# Patient Record
Sex: Female | Born: 1980 | Race: White | Hispanic: No | Smoking: Current every day smoker
Health system: Southern US, Community
[De-identification: ages and names within clinical notes are randomized; demographics above are authoritative.]

## PROBLEM LIST (undated history)

## (undated) DIAGNOSIS — C801 Malignant (primary) neoplasm, unspecified: Secondary | ICD-10-CM

## (undated) DIAGNOSIS — F329 Major depressive disorder, single episode, unspecified: Secondary | ICD-10-CM

## (undated) DIAGNOSIS — F419 Anxiety disorder, unspecified: Secondary | ICD-10-CM

## (undated) DIAGNOSIS — F4310 Post-traumatic stress disorder, unspecified: Secondary | ICD-10-CM

## (undated) DIAGNOSIS — F32A Depression, unspecified: Secondary | ICD-10-CM

## (undated) DIAGNOSIS — I10 Essential (primary) hypertension: Secondary | ICD-10-CM

## (undated) DIAGNOSIS — N200 Calculus of kidney: Secondary | ICD-10-CM

## (undated) DIAGNOSIS — F319 Bipolar disorder, unspecified: Secondary | ICD-10-CM

## (undated) DIAGNOSIS — F909 Attention-deficit hyperactivity disorder, unspecified type: Secondary | ICD-10-CM

## (undated) HISTORY — PX: TUBAL LIGATION: SHX77

## (undated) HISTORY — PX: TONSILLECTOMY: SUR1361

---

## 2014-10-06 ENCOUNTER — Emergency Department: Payer: Self-pay | Admitting: Emergency Medicine

## 2014-10-25 ENCOUNTER — Emergency Department: Payer: Self-pay | Admitting: Internal Medicine

## 2014-11-03 DIAGNOSIS — F419 Anxiety disorder, unspecified: Secondary | ICD-10-CM | POA: Insufficient documentation

## 2014-11-03 DIAGNOSIS — F319 Bipolar disorder, unspecified: Secondary | ICD-10-CM | POA: Insufficient documentation

## 2015-01-06 ENCOUNTER — Emergency Department
Admit: 2015-01-06 | Disposition: A | Discharge status: Home / Self Care | Payer: Self-pay | Admitting: Emergency Medicine

## 2015-01-14 ENCOUNTER — Emergency Department
Admit: 2015-01-14 | Disposition: A | Discharge status: Home / Self Care | Payer: Self-pay | Admitting: Emergency Medicine

## 2015-01-14 LAB — DRUG SCREEN, URINE
Amphetamines, Ur Screen: NEGATIVE
BARBITURATES, UR SCREEN: NEGATIVE
BENZODIAZEPINE, UR SCRN: POSITIVE
CANNABINOID 50 NG, UR ~~LOC~~: NEGATIVE
Cocaine Metabolite,Ur ~~LOC~~: NEGATIVE
MDMA (Ecstasy)Ur Screen: NEGATIVE
METHADONE, UR SCREEN: NEGATIVE
Opiate, Ur Screen: POSITIVE
PHENCYCLIDINE (PCP) UR S: NEGATIVE
Tricyclic, Ur Screen: NEGATIVE

## 2015-01-14 LAB — URINALYSIS, COMPLETE
BILIRUBIN, UR: NEGATIVE
GLUCOSE, UR: NEGATIVE mg/dL (ref 0–75)
Ketone: NEGATIVE
NITRITE: NEGATIVE
Ph: 5 (ref 4.5–8.0)
Protein: NEGATIVE
SPECIFIC GRAVITY: 1.002 (ref 1.003–1.030)

## 2015-03-22 ENCOUNTER — Encounter: Payer: Self-pay | Admitting: Emergency Medicine

## 2015-03-22 ENCOUNTER — Emergency Department
Admission: EM | Admit: 2015-03-22 | Discharge: 2015-03-22 | Disposition: A | Discharge status: Home / Self Care | Payer: Medicare Other | Attending: Emergency Medicine | Admitting: Emergency Medicine

## 2015-03-22 ENCOUNTER — Emergency Department: Payer: Medicare Other

## 2015-03-22 DIAGNOSIS — N3090 Cystitis, unspecified without hematuria: Secondary | ICD-10-CM | POA: Diagnosis not present

## 2015-03-22 DIAGNOSIS — Z720 Tobacco use: Secondary | ICD-10-CM | POA: Diagnosis not present

## 2015-03-22 DIAGNOSIS — R109 Unspecified abdominal pain: Secondary | ICD-10-CM | POA: Diagnosis present

## 2015-03-22 HISTORY — DX: Depression, unspecified: F32.A

## 2015-03-22 HISTORY — DX: Post-traumatic stress disorder, unspecified: F43.10

## 2015-03-22 HISTORY — DX: Bipolar disorder, unspecified: F31.9

## 2015-03-22 HISTORY — DX: Major depressive disorder, single episode, unspecified: F32.9

## 2015-03-22 HISTORY — DX: Attention-deficit hyperactivity disorder, unspecified type: F90.9

## 2015-03-22 HISTORY — DX: Calculus of kidney: N20.0

## 2015-03-22 HISTORY — DX: Anxiety disorder, unspecified: F41.9

## 2015-03-22 LAB — CBC WITH DIFFERENTIAL/PLATELET
Basophils Absolute: 0.1 10*3/uL (ref 0–0.1)
Basophils Relative: 0 %
Eosinophils Absolute: 0.1 10*3/uL (ref 0–0.7)
Eosinophils Relative: 1 %
HEMATOCRIT: 42.4 % (ref 35.0–47.0)
Hemoglobin: 14.5 g/dL (ref 12.0–16.0)
Lymphocytes Relative: 35 %
Lymphs Abs: 4.7 10*3/uL — ABNORMAL HIGH (ref 1.0–3.6)
MCH: 30.7 pg (ref 26.0–34.0)
MCHC: 34.2 g/dL (ref 32.0–36.0)
MCV: 90 fL (ref 80.0–100.0)
Monocytes Absolute: 0.9 10*3/uL (ref 0.2–0.9)
Monocytes Relative: 6 %
Neutro Abs: 7.7 10*3/uL — ABNORMAL HIGH (ref 1.4–6.5)
Neutrophils Relative %: 58 %
Platelets: 337 10*3/uL (ref 150–440)
RBC: 4.72 MIL/uL (ref 3.80–5.20)
RDW: 13.1 % (ref 11.5–14.5)
WBC: 13.4 10*3/uL — ABNORMAL HIGH (ref 3.6–11.0)

## 2015-03-22 LAB — COMPREHENSIVE METABOLIC PANEL
ALBUMIN: 4.9 g/dL (ref 3.5–5.0)
ALT: 28 U/L (ref 14–54)
ANION GAP: 10 (ref 5–15)
AST: 27 U/L (ref 15–41)
Alkaline Phosphatase: 73 U/L (ref 38–126)
BUN: 5 mg/dL — ABNORMAL LOW (ref 6–20)
CALCIUM: 9.2 mg/dL (ref 8.9–10.3)
CO2: 26 mmol/L (ref 22–32)
Chloride: 95 mmol/L — ABNORMAL LOW (ref 101–111)
Creatinine, Ser: 0.79 mg/dL (ref 0.44–1.00)
GFR calc Af Amer: >60 mL/min (ref >60)
GFR calc non Af Amer: >60 mL/min (ref >60)
Glucose, Bld: 109 mg/dL — ABNORMAL HIGH (ref 65–99)
POTASSIUM: 3.5 mmol/L (ref 3.5–5.1)
Sodium: 131 mmol/L — ABNORMAL LOW (ref 135–145)
TOTAL PROTEIN: 8.4 g/dL — AB (ref 6.5–8.1)
Total Bilirubin: 0.5 mg/dL (ref 0.3–1.2)

## 2015-03-22 LAB — URINALYSIS COMPLETE WITH MICROSCOPIC (ARMC ONLY)
BILIRUBIN URINE: NEGATIVE
GLUCOSE, UA: NEGATIVE mg/dL
Ketones, ur: NEGATIVE mg/dL
NITRITE: NEGATIVE
PH: 6 (ref 5.0–8.0)
Protein, ur: NEGATIVE mg/dL
SPECIFIC GRAVITY, URINE: 1.004 — AB (ref 1.005–1.030)

## 2015-03-22 LAB — LIPASE, BLOOD: LIPASE: 39 U/L (ref 22–51)

## 2015-03-22 MED ORDER — CEFTRIAXONE SODIUM IN DEXTROSE 20 MG/ML IV SOLN
INTRAVENOUS | Status: AC
  Administered 2015-03-22: 1 g via INTRAVENOUS
  Filled 2015-03-22: qty 50

## 2015-03-22 MED ORDER — OXYCODONE-ACETAMINOPHEN 5-325 MG PO TABS: 1.0000 | ORAL_TABLET | Freq: Four times a day (QID) | ORAL | Status: DC | PRN

## 2015-03-22 MED ORDER — KETOROLAC TROMETHAMINE 30 MG/ML IJ SOLN
30.0000 mg | Freq: Once | INTRAMUSCULAR | Status: AC
  Administered 2015-03-22: 30 mg via INTRAVENOUS

## 2015-03-22 MED ORDER — SULFAMETHOXAZOLE-TRIMETHOPRIM 800-160 MG PO TABS: 1.0000 | ORAL_TABLET | Freq: Two times a day (BID) | ORAL | Status: DC

## 2015-03-22 MED ORDER — KETOROLAC TROMETHAMINE 30 MG/ML IJ SOLN
INTRAMUSCULAR | Status: AC
Start: 2015-03-22 — End: 2015-03-22
  Administered 2015-03-22: 30 mg via INTRAVENOUS
  Filled 2015-03-22: qty 1

## 2015-03-22 MED ORDER — HYDROMORPHONE HCL 1 MG/ML IJ SOLN
INTRAMUSCULAR | Status: AC
  Administered 2015-03-22: 0.5 mg via INTRAVENOUS
  Filled 2015-03-22: qty 1

## 2015-03-22 MED ORDER — HYDROMORPHONE HCL 1 MG/ML IJ SOLN
INTRAMUSCULAR | Status: AC
  Administered 2015-03-22: 1 mg via INTRAVENOUS
  Filled 2015-03-22: qty 1

## 2015-03-22 MED ORDER — CEFTRIAXONE SODIUM IN DEXTROSE 20 MG/ML IV SOLN
1.0000 g | Freq: Once | INTRAVENOUS | Status: AC
  Administered 2015-03-22: 1 g via INTRAVENOUS

## 2015-03-22 MED ORDER — HYDROMORPHONE HCL 1 MG/ML IJ SOLN
1.0000 mg | Freq: Once | INTRAMUSCULAR | Status: AC
  Administered 2015-03-22: 1 mg via INTRAVENOUS

## 2015-03-22 MED ORDER — HYDROMORPHONE HCL 1 MG/ML IJ SOLN
0.5000 mg | Freq: Once | INTRAMUSCULAR | Status: AC
Start: 2015-03-22 — End: 2015-03-22
  Administered 2015-03-22: 0.5 mg via INTRAVENOUS

## 2015-03-22 NOTE — ED Notes (Signed)
Patient transported to CT 

## 2015-03-22 NOTE — ED Notes (Signed)
Patient requesting pain medication. MD notified, no new orders received.

## 2015-03-22 NOTE — ED Notes (Signed)
MD at bedside. 

## 2015-03-22 NOTE — ED Notes (Signed)
Pt reports left side lower back pain and blood in urine for past 2 days. Unknown fever. Pt also endorses lower abd cramping.

## 2015-03-22 NOTE — ED Notes (Signed)
Returned to room.

## 2015-03-22 NOTE — Discharge Instructions (Signed)
Flank Pain °Flank pain refers to pain that is located on the side of the body between the upper abdomen and the back. The pain may occur over a short period of time (acute) or may be long-term or reoccurring (chronic). It may be mild or severe. Flank pain can be caused by many things. °CAUSES  °Some of the more common causes of flank pain include: °· Muscle strains.   °· Muscle spasms.   °· A disease of your spine (vertebral disk disease).   °· A lung infection (pneumonia).   °· Fluid around your lungs (pulmonary edema).   °· A kidney infection.   °· Kidney stones.   °· A very painful skin rash caused by the chickenpox virus (shingles).   °· Gallbladder disease.   °HOME CARE INSTRUCTIONS  °Home care will depend on the cause of your pain. In general, °· Rest as directed by your caregiver. °· Drink enough fluids to keep your urine clear or pale yellow. °· Only take over-the-counter or prescription medicines as directed by your caregiver. Some medicines may help relieve the pain. °· Tell your caregiver about any changes in your pain. °· Follow up with your caregiver as directed. °SEEK IMMEDIATE MEDICAL CARE IF:  °· Your pain is not controlled with medicine.   °· You have new or worsening symptoms. °· Your pain increases.   °· You have abdominal pain.   °· You have shortness of breath.   °· You have persistent nausea or vomiting.   °· You have swelling in your abdomen.   °· You feel faint or pass out.   °· You have blood in your urine. °· You have a fever or persistent symptoms for more than 2-3 days. °· You have a fever and your symptoms suddenly get worse. °MAKE SURE YOU:  °· Understand these instructions. °· Will watch your condition. °· Will get help right away if you are not doing well or get worse. °Document Released: 11/10/2005 Document Revised: 06/13/2012 Document Reviewed: 05/03/2012 °[employer]® Patient Information ©2015 [employer]. This information is not intended to replace advice given to you by your  health care provider. Make sure you discuss any questions you have with your health care provider. ° °

## 2015-03-22 NOTE — ED Provider Notes (Signed)
[facility] Emergency Department Provider Note     Time seen: ----------------------------------------- 8:24 PM on 03/22/2015 -----------------------------------------    I have reviewed the triage vital signs and the nursing notes.   HISTORY  Chief Complaint Abdominal Pain    HPI Jennifer Taylor is a 34 y.o. female who presents ER for left-sided lower back pain and blood in her urine for last 2 days. Patient states she doesn't history kidney stones, has significant blood in her urine. This pain is out of proportion to her, nothing makes it better or worse. Sharp pain so she went nausea. Pain is severe currently   Past Medical History  Diagnosis Date  . Kidney stones   . Depression   . ADHD (attention deficit hyperactivity disorder)   . PTSD (post-traumatic stress disorder)   . Bipolar 1 disorder   . Anxiety     There are no active problems to display for this patient.   Past Surgical History  Procedure Laterality Date  . Tubal ligation    . Tonsillectomy      Allergies Review of patient's allergies indicates no known allergies.  Social History History  Substance Use Topics  . Smoking status: Current Every Day Smoker -- 1.00 packs/day    Types: Cigarettes  . Smokeless tobacco: Not on file  . Alcohol Use: No    Review of Systems Constitutional: Negative for fever. Eyes: Negative for visual changes. ENT: Negative for sore throat. Cardiovascular: Negative for chest pain. Respiratory: Negative for shortness of breath. Gastrointestinal: Positive for flank pain Genitourinary: Positive for hematuria Musculoskeletal: Negative for back pain. Skin: Negative for rash. Neurological: Negative for headaches, focal weakness or numbness.  10-point ROS otherwise negative.  ____________________________________________   PHYSICAL EXAM:  VITAL SIGNS: ED Triage Vitals  Enc Vitals Group     BP 03/22/15 1841 131/100 mmHg     Pulse  Rate 03/22/15 1841 118     Resp 03/22/15 1841 18     Temp 03/22/15 1841 97.7 F (36.5 C)     Temp Source 03/22/15 1841 Oral     SpO2 03/22/15 1841 99 %     Weight 03/22/15 1841 186 lb (84.369 kg)     Height 03/22/15 1841 5\' 7"  (1.702 m)     Head Cir --      Peak Flow --      Pain Score 03/22/15 1843 8     Pain Loc --      Pain Edu? --      Excl. in [place]? --     Constitutional: Alert and oriented. Well appearing and in no distress. Eyes: Conjunctivae are normal. PERRL. Normal extraocular movements. ENT   Head: Normocephalic and atraumatic.   Nose: No congestion/rhinnorhea.   Mouth/Throat: Mucous membranes are moist.   Neck: No stridor. Hematological/Lymphatic/Immunilogical: No cervical lymphadenopathy. Cardiovascular: Normal rate, regular rhythm. Normal and symmetric distal pulses are present in all extremities. No murmurs, rubs, or gallops. Respiratory: Normal respiratory effort without tachypnea nor retractions. Breath sounds are clear and equal bilaterally. No wheezes/rales/rhonchi. Gastrointestinal: Soft and nontender. No distention. No abdominal bruits. Left CVA tenderness is positive Musculoskeletal: Nontender with normal range of motion in all extremities. No joint effusions.  No lower extremity tenderness nor edema. Neurologic:  Normal speech and language. No gross focal neurologic deficits are appreciated. Speech is normal. No gait instability. Skin:  Skin is warm, dry and intact. No rash noted. Psychiatric: Mood and affect are normal. Speech and behavior are normal. Patient exhibits  appropriate insight and judgment.  ____________________________________________  ED COURSE:  Pertinent labs & imaging results that were available during my care of the patient were reviewed by me and considered in my medical decision making (see chart for details). Will need CT scan or ultrasound to evaluate. Likely renal colic ____________________________________________     LABS (pertinent positives/negatives)  Labs Reviewed  CBC WITH DIFFERENTIAL/PLATELET - Abnormal; Notable for the following:    WBC 13.4 (*)    Neutro Abs 7.7 (*)    Lymphs Abs 4.7 (*)    All other components within normal limits  COMPREHENSIVE METABOLIC PANEL - Abnormal; Notable for the following:    Sodium 131 (*)    Chloride 95 (*)    Glucose, Bld 109 (*)    BUN 5 (*)    Total Protein 8.4 (*)    All other components within normal limits  URINALYSIS COMPLETEWITH MICROSCOPIC ([facility] ONLY) - Abnormal; Notable for the following:    Color, Urine YELLOW (*)    APPearance HAZY (*)    Specific Gravity, Urine 1.004 (*)    Hgb urine dipstick 3+ (*)    Leukocytes, UA 3+ (*)    Bacteria, UA RARE (*)    Squamous Epithelial / LPF 0-5 (*)    All other components within normal limits  LIPASE, BLOOD    RADIOLOGY  CT renal protocol IMPRESSION: Slight hydronephrosis on the right without renal or ureteral calculus. Question recent calculus passage. Pyelonephritis could present similarly and is a differential consideration. No evidence of renal abscess or perinephric fluid on the right.  No hydronephrosis on the left. No left renal or ureteral calculus.  No bowel obstruction. No abscess. Appendix appears normal. There is a minimal ventral hernia containing only fat. ____________________________________________  FINAL ASSESSMENT AND PLAN  Flank pain  Plan: Either recently passed stone or pyelonephritis. Patient was given Toradol, Dilaudid, IV Rocephin. Patient is in no acute distress and stable for outpatient follow-up with her doctor in 2 days for recheck.   Earleen Newport, MD   Earleen Newport, MD 03/22/15 [phone]

## 2015-04-29 ENCOUNTER — Emergency Department: Payer: Medicare Other

## 2015-04-29 ENCOUNTER — Emergency Department
Admission: EM | Admit: 2015-04-29 | Discharge: 2015-04-29 | Disposition: A | Discharge status: Home / Self Care | Payer: Medicare Other | Attending: Emergency Medicine | Admitting: Emergency Medicine

## 2015-04-29 DIAGNOSIS — Z87442 Personal history of urinary calculi: Secondary | ICD-10-CM | POA: Insufficient documentation

## 2015-04-29 DIAGNOSIS — Z720 Tobacco use: Secondary | ICD-10-CM | POA: Diagnosis not present

## 2015-04-29 DIAGNOSIS — Z79899 Other long term (current) drug therapy: Secondary | ICD-10-CM | POA: Insufficient documentation

## 2015-04-29 DIAGNOSIS — R109 Unspecified abdominal pain: Secondary | ICD-10-CM | POA: Insufficient documentation

## 2015-04-29 DIAGNOSIS — R102 Pelvic and perineal pain: Secondary | ICD-10-CM

## 2015-04-29 LAB — CBC
HEMATOCRIT: 41.5 % (ref 35.0–47.0)
HEMOGLOBIN: 14.2 g/dL (ref 12.0–16.0)
MCH: 30.2 pg (ref 26.0–34.0)
MCHC: 34.2 g/dL (ref 32.0–36.0)
MCV: 88.3 fL (ref 80.0–100.0)
Platelets: 295 10*3/uL (ref 150–440)
RBC: 4.7 MIL/uL (ref 3.80–5.20)
RDW: 13 % (ref 11.5–14.5)
WBC: 13.9 10*3/uL — ABNORMAL HIGH (ref 3.6–11.0)

## 2015-04-29 LAB — URINALYSIS COMPLETE WITH MICROSCOPIC (ARMC ONLY)
BILIRUBIN URINE: NEGATIVE
Glucose, UA: NEGATIVE mg/dL
Ketones, ur: NEGATIVE mg/dL
NITRITE: NEGATIVE
Protein, ur: NEGATIVE mg/dL
SPECIFIC GRAVITY, URINE: 1.006 (ref 1.005–1.030)
pH: 6 (ref 5.0–8.0)

## 2015-04-29 LAB — COMPREHENSIVE METABOLIC PANEL
ALT: 27 U/L (ref 14–54)
AST: 27 U/L (ref 15–41)
Albumin: 4.2 g/dL (ref 3.5–5.0)
Alkaline Phosphatase: 65 U/L (ref 38–126)
Anion gap: 8 (ref 5–15)
BILIRUBIN TOTAL: 0.4 mg/dL (ref 0.3–1.2)
BUN: 8 mg/dL (ref 6–20)
CALCIUM: 9.2 mg/dL (ref 8.9–10.3)
CO2: 28 mmol/L (ref 22–32)
Chloride: 103 mmol/L (ref 101–111)
Creatinine, Ser: 0.71 mg/dL (ref 0.44–1.00)
GFR calc Af Amer: >60 mL/min (ref >60)
GFR calc non Af Amer: >60 mL/min (ref >60)
Glucose, Bld: 88 mg/dL (ref 65–99)
Potassium: 3.9 mmol/L (ref 3.5–5.1)
Sodium: 139 mmol/L (ref 135–145)
Total Protein: 7.6 g/dL (ref 6.5–8.1)

## 2015-04-29 LAB — WET PREP, GENITAL
CLUE CELLS WET PREP: NONE SEEN
TRICH WET PREP: NONE SEEN
Yeast Wet Prep HPF POC: NONE SEEN

## 2015-04-29 LAB — CHLAMYDIA/NGC RT PCR (ARMC ONLY)
Chlamydia Tr: NOT DETECTED
N gonorrhoeae: NOT DETECTED

## 2015-04-29 LAB — POCT PREGNANCY, URINE: Preg Test, Ur: NEGATIVE

## 2015-04-29 MED ORDER — HYDROCODONE-ACETAMINOPHEN 5-325 MG PO TABS: 1.0000 | ORAL_TABLET | ORAL | Status: DC | PRN

## 2015-04-29 MED ORDER — CEFTRIAXONE SODIUM 250 MG IJ SOLR
250.0000 mg | Freq: Once | INTRAMUSCULAR | Status: AC
  Administered 2015-04-29: 250 mg via INTRAMUSCULAR
  Filled 2015-04-29: qty 250

## 2015-04-29 MED ORDER — AZITHROMYCIN 250 MG PO TABS
1000.0000 mg | ORAL_TABLET | Freq: Once | ORAL | Status: AC
  Administered 2015-04-29: 1000 mg via ORAL
  Filled 2015-04-29: qty 4

## 2015-04-29 MED ORDER — CEPHALEXIN 500 MG PO CAPS: 500.0000 mg | ORAL_CAPSULE | Freq: Three times a day (TID) | ORAL | Status: AC

## 2015-04-29 MED ORDER — OXYCODONE-ACETAMINOPHEN 5-325 MG PO TABS
2.0000 | ORAL_TABLET | Freq: Once | ORAL | Status: AC
  Administered 2015-04-29: 2 via ORAL
  Filled 2015-04-29: qty 2

## 2015-04-29 MED ORDER — KETOROLAC TROMETHAMINE 10 MG PO TABS
10.0000 mg | ORAL_TABLET | Freq: Once | ORAL | Status: AC
  Administered 2015-04-29: 10 mg via ORAL
  Filled 2015-04-29: qty 1

## 2015-04-29 NOTE — Discharge Instructions (Signed)
You have been seen in the emergency department today for blood in your urine, as well as abdominal pain. Her workup today has shown largely normal results. As we have discussed you have been placed on antibiotics for a possible urinary tract infection. We have also treated for possible sexually transmitted diseases in the emergency department today. Please have any sexual partner tested and treated as well before resuming sexual activity. Please follow-up with your primary care doctor soon as possible regarding your ongoing abdominal pain.    Abdominal Pain, Women Abdominal (stomach, pelvic, or belly) pain can be caused by many things. It is important to tell your doctor:  The location of the pain.  Does it come and go or is it present all the time?  Are there things that start the pain (eating certain foods, exercise)?  Are there other symptoms associated with the pain (fever, nausea, vomiting, diarrhea)? All of this is helpful to know when trying to find the cause of the pain. CAUSES   Stomach: virus or bacteria infection, or ulcer.  Intestine: appendicitis (inflamed appendix), regional ileitis (Crohn's disease), ulcerative colitis (inflamed colon), irritable bowel syndrome, diverticulitis (inflamed diverticulum of the colon), or cancer of the stomach or intestine.  Gallbladder disease or stones in the gallbladder.  Kidney disease, kidney stones, or infection.  Pancreas infection or cancer.  Fibromyalgia (pain disorder).  Diseases of the female organs:  Uterus: fibroid (non-cancerous) tumors or infection.  Fallopian tubes: infection or tubal pregnancy.  Ovary: cysts or tumors.  Pelvic adhesions (scar tissue).  Endometriosis (uterus lining tissue growing in the pelvis and on the pelvic organs).  Pelvic congestion syndrome (female organs filling up with blood just before the menstrual period).  Pain with the menstrual period.  Pain with ovulation (producing an  egg).  Pain with an IUD (intrauterine device, birth control) in the uterus.  Cancer of the female organs.  Functional pain (pain not caused by a disease, may improve without treatment).  Psychological pain.  Depression. DIAGNOSIS  Your doctor will decide the seriousness of your pain by doing an examination.  Blood tests.  X-rays.  Ultrasound.  CT scan (computed tomography, special type of X-ray).  MRI (magnetic resonance imaging).  Cultures, for infection.  Barium enema (dye inserted in the large intestine, to better view it with X-rays).  Colonoscopy (looking in intestine with a lighted tube).  Laparoscopy (minor surgery, looking in abdomen with a lighted tube).  Major abdominal exploratory surgery (looking in abdomen with a large incision). TREATMENT  The treatment will depend on the cause of the pain.   Many cases can be observed and treated at home.  Over-the-counter medicines recommended by your caregiver.  Prescription medicine.  Antibiotics, for infection.  Birth control pills, for painful periods or for ovulation pain.  Hormone treatment, for endometriosis.  Nerve blocking injections.  Physical therapy.  Antidepressants.  Counseling with a psychologist or psychiatrist.  Minor or major surgery. HOME CARE INSTRUCTIONS   Do not take laxatives, unless directed by your caregiver.  Take over-the-counter pain medicine only if ordered by your caregiver. Do not take aspirin because it can cause an upset stomach or bleeding.  Try a clear liquid diet (broth or water) as ordered by your caregiver. Slowly move to a bland diet, as tolerated, if the pain is related to the stomach or intestine.  Have a thermometer and take your temperature several times a day, and record it.  Bed rest and sleep, if it helps the pain.  Avoid sexual intercourse, if it causes pain.  Avoid stressful situations.  Keep your follow-up appointments and tests, as your caregiver  orders.  If the pain does not go away with medicine or surgery, you may try:  Acupuncture.  Relaxation exercises (yoga, meditation).  Group therapy.  Counseling. SEEK MEDICAL CARE IF:   You notice certain foods cause stomach pain.  Your home care treatment is not helping your pain.  You need stronger pain medicine.  You want your IUD removed.  You feel faint or lightheaded.  You develop nausea and vomiting.  You develop a rash.  You are having side effects or an allergy to your medicine. SEEK IMMEDIATE MEDICAL CARE IF:   Your pain does not go away or gets worse.  You have a fever.  Your pain is felt only in portions of the abdomen. The right side could possibly be appendicitis. The left lower portion of the abdomen could be colitis or diverticulitis.  You are passing blood in your stools (bright red or black tarry stools, with or without vomiting).  You have blood in your urine.  You develop chills, with or without a fever.  You pass out. MAKE SURE YOU:   Understand these instructions.  Will watch your condition.  Will get help right away if you are not doing well or get worse. Document Released: 07/17/2007 Document Revised: 02/03/2014 Document Reviewed: 08/06/2009 [facility] Patient Information 2015 [place], [place]. This information is not intended to replace advice given to you by your health care provider. Make sure you discuss any questions you have with your health care provider.

## 2015-04-29 NOTE — ED Notes (Signed)
MD Paduchowski at bedside  

## 2015-04-29 NOTE — ED Provider Notes (Addendum)
[facility] Emergency Department Provider Note  Time seen: 3:03 PM  I have reviewed the triage vital signs and the nursing notes.   HISTORY  Chief Complaint Flank Pain and Hematuria    HPI Jennifer Taylor is a 34 y.o. female with a past medical history of kidney stones, depression, ADHD, PTSD, bipolar, anxiety, presents the emergency department with left flank pain. According to the patient for the past 3 months she has had intermittent left flank to left lower quadrant pain. She was told a long time ago that she had ovarian cysts, but has not had any further evaluation since moving to [place] months ago. Patient was seen here several weeks ago with a similar complaint diagnosed with kidney stones. Of note the patient had a CT scan performed that day which did not show any renal stones or ureteral stones. Patient denies any dysuria at this time. Patient does state moderate vaginal discharge. Denies vaginal bleeding. Denies fever, nausea, vomiting. Patient states she is on oxycodone at home for her chronic pain but states that her primary care doctor is out of town on vacation, she ran out of medication.     Past Medical History  Diagnosis Date  . Kidney stones   . Depression   . ADHD (attention deficit hyperactivity disorder)   . PTSD (post-traumatic stress disorder)   . Bipolar 1 disorder   . Anxiety   . Kidney stones     There are no active problems to display for this patient.   Past Surgical History  Procedure Laterality Date  . Tubal ligation    . Tonsillectomy      Current Outpatient Rx  Name  Route  Sig  Dispense  Refill  . alprazolam (XANAX) 2 MG tablet   Oral   Take 2 mg by mouth 3 (three) times daily.         Marland Kitchen amphetamine-dextroamphetamine (ADDERALL XR) 10 MG 24 hr capsule   Oral   Take 300 mg by mouth daily.         . citalopram (CELEXA) 20 MG tablet   Oral   Take 25 mg by mouth daily.         Marland Kitchen gabapentin  (NEURONTIN) 100 MG capsule   Oral   Take 100 mg by mouth 3 (three) times daily.         Marland Kitchen lamoTRIgine (LAMICTAL) 200 MG tablet   Oral   Take 250 mg by mouth daily.         Marland Kitchen oxyCODONE-acetaminophen (ROXICET) 5-325 MG per tablet   Oral   Take 1 tablet by mouth every 6 (six) hours as needed.   20 tablet   0   . sulfamethoxazole-trimethoprim (BACTRIM DS) 800-160 MG per tablet   Oral   Take 1 tablet by mouth 2 (two) times daily.   20 tablet   0     Allergies Review of patient's allergies indicates no known allergies.  No family history on file.  Social History History  Substance Use Topics  . Smoking status: Current Every Day Smoker -- 1.00 packs/day    Types: Cigarettes  . Smokeless tobacco: Not on file  . Alcohol Use: No    Review of Systems Constitutional: Negative for fever. Cardiovascular: Negative for chest pain. Respiratory: Negative for shortness of breath. Gastrointestinal: Positive for left-sided abdominal pain. Genitourinary: Negative for dysuria. Positive for vaginal discharge. Musculoskeletal: Negative for back pain. 10-point ROS otherwise negative.  ____________________________________________   PHYSICAL EXAM:  VITAL SIGNS: ED Triage Vitals  Enc Vitals Group     BP 04/29/15 1106 129/75 mmHg     Pulse Rate 04/29/15 1106 114     Resp 04/29/15 1106 16     Temp 04/29/15 1106 98.2 F (36.8 C)     Temp Source 04/29/15 1106 Oral     SpO2 04/29/15 1106 98 %     Weight 04/29/15 1106 187 lb (84.823 kg)     Height 04/29/15 1106 5\' 7"  (1.702 m)     Head Cir --      Peak Flow --      Pain Score 04/29/15 1107 7     Pain Loc --      Pain Edu? --      Excl. in [place]? --     Constitutional: Alert and oriented. Well appearing and in no distress. Eyes: Normal exam ENT   Head: Normocephalic and atraumatic. Cardiovascular: Normal rate, regular rhythm. No murmur Respiratory: Normal respiratory effort without tachypnea nor retractions. Breath sounds  are clear  Gastrointestinal: Moderate tenderness palpation in the left lower quadrant/left pelvis area. No CVA tenderness palpation. No rebound no guarding. No distention Musculoskeletal: Nontender with normal range of motion in all extremities.  Neurologic:  Normal speech and language. No gross focal neurologic deficits  Skin:  Skin is warm, dry and intact.  Psychiatric: Mood and affect are normal. Speech and behavior are normal.  ____________________________________________     RADIOLOGY  7 within normal limits  ____________________________________________   INITIAL IMPRESSION / ASSESSMENT AND PLAN / ED COURSE  Pertinent labs & imaging results that were available during my care of the patient were reviewed by me and considered in my medical decision making (see chart for details).  Patient with left lower quadrant abdominal pain, history of ovarian cyst. Patient also with a history of kidney stones, however recent CT scan did not show any obvious stones. We will check labs, pelvic exam, pelvic ultrasound to help further evaluate.    Minimal discharge on exam. Patient wishes to go ahead and be treated for STDs. Patient doesn't mild left adnexal tenderness currently awaiting ultrasound results. Given the patient's hematuria, with no evidence of renal stone on the past CT scan. We will cover the patient with antibiotics, and send a urine culture.  Ultrasound within normal limits. Wet prep within normal limits we'll discharge home on anabiotic. Patient follow up with OB/GYN.  ____________________________________________   FINAL CLINICAL IMPRESSION(S) / ED DIAGNOSES  Left adnexal pain.  Left abdominal pain   Harvest Dark, MD 04/29/15 [place], MD 04/29/15 [phone]

## 2015-04-29 NOTE — ED Notes (Signed)
Pt c/o blood in urine for the past 2 days, and c/o BL Flank pain for the past month and a half.Marland Kitchen

## 2015-04-29 NOTE — ED Notes (Signed)
Pt received medication for pain while awaiting for a treatment room

## 2015-04-29 NOTE — ED Notes (Signed)
Pt to [place] at this time via ED tech.

## 2015-04-30 LAB — URINE CULTURE

## 2015-05-26 ENCOUNTER — Encounter: Payer: Self-pay | Admitting: Urgent Care

## 2015-05-26 DIAGNOSIS — Z3202 Encounter for pregnancy test, result negative: Secondary | ICD-10-CM | POA: Insufficient documentation

## 2015-05-26 DIAGNOSIS — Z23 Encounter for immunization: Secondary | ICD-10-CM | POA: Insufficient documentation

## 2015-05-26 DIAGNOSIS — S61214A Laceration without foreign body of right ring finger without damage to nail, initial encounter: Secondary | ICD-10-CM | POA: Diagnosis not present

## 2015-05-26 DIAGNOSIS — S61216A Laceration without foreign body of right little finger without damage to nail, initial encounter: Secondary | ICD-10-CM | POA: Insufficient documentation

## 2015-05-26 DIAGNOSIS — Z720 Tobacco use: Secondary | ICD-10-CM | POA: Insufficient documentation

## 2015-05-26 DIAGNOSIS — Z79899 Other long term (current) drug therapy: Secondary | ICD-10-CM | POA: Insufficient documentation

## 2015-05-26 DIAGNOSIS — Z4801 Encounter for change or removal of surgical wound dressing: Secondary | ICD-10-CM | POA: Diagnosis not present

## 2015-05-26 DIAGNOSIS — Y998 Other external cause status: Secondary | ICD-10-CM | POA: Insufficient documentation

## 2015-05-26 DIAGNOSIS — Y9289 Other specified places as the place of occurrence of the external cause: Secondary | ICD-10-CM | POA: Insufficient documentation

## 2015-05-26 DIAGNOSIS — Y9389 Activity, other specified: Secondary | ICD-10-CM | POA: Insufficient documentation

## 2015-05-26 DIAGNOSIS — X58XXXD Exposure to other specified factors, subsequent encounter: Secondary | ICD-10-CM | POA: Diagnosis not present

## 2015-05-26 DIAGNOSIS — S61214D Laceration without foreign body of right ring finger without damage to nail, subsequent encounter: Secondary | ICD-10-CM | POA: Diagnosis not present

## 2015-05-26 DIAGNOSIS — W260XXA Contact with knife, initial encounter: Secondary | ICD-10-CM | POA: Insufficient documentation

## 2015-05-26 NOTE — ED Notes (Signed)
Patient presents to ED with lacerations to [facility] - 3rd and 4th digits and RH 1st digit that was sustained tonight while washing dishes; cut on a butcher knife. Patient asking for a pregnancy test in triage; has had BTL; tech collected urine, but no order for test placed by triage nurse.

## 2015-05-27 ENCOUNTER — Encounter: Payer: Self-pay | Admitting: Urgent Care

## 2015-05-27 ENCOUNTER — Emergency Department
Admission: EM | Admit: 2015-05-27 | Discharge: 2015-05-28 | Disposition: A | Discharge status: Home / Self Care | Payer: Medicare Other | Attending: Emergency Medicine | Admitting: Emergency Medicine

## 2015-05-27 ENCOUNTER — Emergency Department
Admission: EM | Admit: 2015-05-27 | Discharge: 2015-05-27 | Disposition: A | Discharge status: Home / Self Care | Payer: Medicare Other | Attending: Emergency Medicine | Admitting: Emergency Medicine

## 2015-05-27 DIAGNOSIS — S61216A Laceration without foreign body of right little finger without damage to nail, initial encounter: Secondary | ICD-10-CM | POA: Diagnosis not present

## 2015-05-27 DIAGNOSIS — IMO0002 Reserved for concepts with insufficient information to code with codable children: Secondary | ICD-10-CM

## 2015-05-27 DIAGNOSIS — S61219A Laceration without foreign body of unspecified finger without damage to nail, initial encounter: Secondary | ICD-10-CM

## 2015-05-27 DIAGNOSIS — Z720 Tobacco use: Secondary | ICD-10-CM | POA: Diagnosis not present

## 2015-05-27 DIAGNOSIS — Z4801 Encounter for change or removal of surgical wound dressing: Secondary | ICD-10-CM | POA: Insufficient documentation

## 2015-05-27 DIAGNOSIS — Y9389 Activity, other specified: Secondary | ICD-10-CM | POA: Diagnosis not present

## 2015-05-27 DIAGNOSIS — S61214D Laceration without foreign body of right ring finger without damage to nail, subsequent encounter: Secondary | ICD-10-CM | POA: Insufficient documentation

## 2015-05-27 DIAGNOSIS — Y9289 Other specified places as the place of occurrence of the external cause: Secondary | ICD-10-CM | POA: Diagnosis not present

## 2015-05-27 DIAGNOSIS — W260XXA Contact with knife, initial encounter: Secondary | ICD-10-CM | POA: Diagnosis not present

## 2015-05-27 DIAGNOSIS — Z3202 Encounter for pregnancy test, result negative: Secondary | ICD-10-CM | POA: Diagnosis not present

## 2015-05-27 DIAGNOSIS — Z79899 Other long term (current) drug therapy: Secondary | ICD-10-CM | POA: Diagnosis not present

## 2015-05-27 DIAGNOSIS — Z23 Encounter for immunization: Secondary | ICD-10-CM | POA: Diagnosis not present

## 2015-05-27 DIAGNOSIS — X58XXXD Exposure to other specified factors, subsequent encounter: Secondary | ICD-10-CM | POA: Insufficient documentation

## 2015-05-27 DIAGNOSIS — Y998 Other external cause status: Secondary | ICD-10-CM | POA: Diagnosis not present

## 2015-05-27 DIAGNOSIS — S61214A Laceration without foreign body of right ring finger without damage to nail, initial encounter: Secondary | ICD-10-CM | POA: Diagnosis present

## 2015-05-27 LAB — POCT PREGNANCY, URINE: Preg Test, Ur: NEGATIVE

## 2015-05-27 MED ORDER — TETANUS-DIPHTH-ACELL PERTUSSIS 5-2.5-18.5 LF-MCG/0.5 IM SUSP
0.5000 mL | Freq: Once | INTRAMUSCULAR | Status: AC
  Administered 2015-05-27: 0.5 mL via INTRAMUSCULAR
  Filled 2015-05-27: qty 0.5

## 2015-05-27 MED ORDER — TRAMADOL HCL 50 MG PO TABS
50.0000 mg | ORAL_TABLET | Freq: Once | ORAL | Status: AC
  Administered 2015-05-27: 50 mg via ORAL
  Filled 2015-05-27: qty 1

## 2015-05-27 MED ORDER — IBUPROFEN 600 MG PO TABS
600.0000 mg | ORAL_TABLET | Freq: Once | ORAL | Status: AC
  Administered 2015-05-27: 600 mg via ORAL
  Filled 2015-05-27: qty 1

## 2015-05-27 NOTE — Discharge Instructions (Signed)
Sterile Tape Wound Care Some cuts and wounds can be closed using sterile tape, also called skin adhesive strips. Skin adhesive strips can be used for shallow (superficial) and simple cuts, wounds, lacerations, and surgical incisions. These strips act in place of stitches to hold the edges of the wound together, allowing for faster healing. Unlike stitches, the adhesive strips do not require needles or anesthetic medicine for placement. The strips will wear off naturally as the wound is healing. It is important to take proper care of your wound at home while it heals.  HOME CARE INSTRUCTIONS  Try to keep the area around your wound clean and dry. Do not allow the adhesive strips to get wet for the first 12 hours.   Do not use any soaps or ointments on the wound for the first 12 hours.   If a bandage (dressing) has been applied, follow your health care provider's instructions for how often to change the dressing. Keep the dressing dry if one has been applied.   Do not remove the adhesive strips. They will fall off on their own. If they do not, you may remove them gently after 10 days. You should gently wet the strips before removing them. For example, this can be done in the shower.  Do not scratch, pick, or rub the wound area.   Protect the wound from further injury until it is healed.   Protect the wound from sun and tanning bed exposure while it is healing and for several weeks after healing.   Only take over-the-counter or prescription medicines as directed by your health care provider.   Keep all follow-up appointments as directed by your health care provider.  SEEK MEDICAL CARE IF: Your adhesive strips become wet or soaked with blood before the wound has healed. The tape will need to be replaced.  SEEK IMMEDIATE MEDICAL CARE IF:  You have increasing pain in the wound.   You develop a rash after the strips are applied.  Your wound becomes red, swollen, hot, or tender.   You  have a red streak that goes away from the wound.   You have pus coming from the wound.   You have increased bleeding from the wound.  You notice a bad smell coming from the wound.   Your wound breaks open. MAKE SURE YOU:  Understand these instructions.  Will watch your condition.  Will get help right away if you are not doing well or get worse. Document Released: 10/27/2004 Document Revised: 07/10/2013 Document Reviewed: 04/10/2013 [facility] Patient Information 2015 [place], [place]. This information is not intended to replace advice given to you by your health care provider. Make sure you discuss any questions you have with your health care provider.  Keep the wound clean, dry, and covered.  Wear the splint as needed for protection. Follow-up with your provider as needed. Take Tylenol or Motrin for pain relief.

## 2015-05-27 NOTE — ED Notes (Signed)
Patient presents with lacerations to fingers; here last night and had steri-strips placed. Patient reports that strips came off today and she is back to have the replaced. Fingers swollen per her report.

## 2015-05-27 NOTE — ED Provider Notes (Signed)
[facility] Emergency Department Provider Note ____________________________________________  Time seen: 2330  I have reviewed the triage vital signs and the nursing notes.  HISTORY  Chief Complaint  Laceration  HPI  Jennifer Taylor is a 34 y.o. female presents for wound check. The wound is without signs of infection.  The wound edges are not approximated due to loss of previously placed steri-strips.   Review of Systems  Constitutional: Negative for fever. HEENT:  Normocephalic/atraumatic. Negative for visual/hearingchanges, sore throat, or nasal congestion. Cardiovascular: Negative for chest pain. Respiratory: Negative for shortness of breath. Musculoskeletal: Negative for back pain. Skin: Linear laceration down with ulnar side of her right ring finger distal phalanx.  Neurological: Negative for headaches, focal weakness or numbness. Hematological/Lymphatic:Negative for enlarged lymph nodes  LACERATION REPAIR Performed by: Melvenia Needles Authorized by: Melvenia Needles Consent: Verbal consent obtained. Risks and benefits: risks, benefits and alternatives were discussed Consent given by: patient Patient identity confirmed: provided demographic data Prepped and Draped in normal sterile fashion Wound explored  Laceration Location: right ring finger  Laceration Length: 2 cm  No Foreign Bodies seen or palpated  Irrigation method: soap & water wash  Amount of cleaning: standard  Skin closure: steri-strips  Patient tolerance: Patient tolerated the procedure well with no immediate complications. _____________________________________________________________  INITIAL IMPRESSION / ASSESSMENT AND PLAN / ED COURSE  Old laceration s/p steri-strip repair. Finger splint placed for protection. Ultram 50 mg PO x 1 provider for acute pain. No prescription for narcotic pain medicine, despite patient's request.  Wound care and activity  instructions given. Return prn.  FINAL CLINICAL IMPRESSION(S) / ED DIAGNOSES  Final diagnoses:  Encounter for re-check of laceration wound    Melvenia Needles, PA-C 05/28/15 0003  Orbie Pyo, MD 05/28/15 [phone]

## 2015-05-27 NOTE — Discharge Instructions (Signed)
Laceration Care, Adult °A laceration is a cut or lesion that goes through all layers of the skin and into the tissue just beneath the skin. °TREATMENT  °Some lacerations may not require closure. Some lacerations may not be able to be closed due to an increased risk of infection. It is important to see your caregiver as soon as possible after an injury to minimize the risk of infection and maximize the opportunity for successful closure. °If closure is appropriate, pain medicines may be given, if needed. The wound will be cleaned to help prevent infection. Your caregiver will use stitches (sutures), staples, wound glue (adhesive), or skin adhesive strips to repair the laceration. These tools bring the skin edges together to allow for faster healing and a better cosmetic outcome. However, all wounds will heal with a scar. Once the wound has healed, scarring can be minimized by covering the wound with sunscreen during the day for 1 full year. °HOME CARE INSTRUCTIONS  °For sutures or staples: °· Keep the wound clean and dry. °· If you were given a bandage (dressing), you should change it at least once a day. Also, change the dressing if it becomes wet or dirty, or as directed by your caregiver. °· Wash the wound with soap and water 2 times a day. Rinse the wound off with water to remove all soap. Pat the wound dry with a clean towel. °· After cleaning, apply a thin layer of the antibiotic ointment as recommended by your caregiver. This will help prevent infection and keep the dressing from sticking. °· You may shower as usual after the first 24 hours. Do not soak the wound in water until the sutures are removed. °· Only take over-the-counter or prescription medicines for pain, discomfort, or fever as directed by your caregiver. °· Get your sutures or staples removed as directed by your caregiver. °For skin adhesive strips: °· Keep the wound clean and dry. °· Do not get the skin adhesive strips wet. You may bathe  carefully, using caution to keep the wound dry. °· If the wound gets wet, pat it dry with a clean towel. °· Skin adhesive strips will fall off on their own. You may trim the strips as the wound heals. Do not remove skin adhesive strips that are still stuck to the wound. They will fall off in time. °For wound adhesive: °· You may briefly wet your wound in the shower or bath. Do not soak or scrub the wound. Do not swim. Avoid periods of heavy perspiration until the skin adhesive has fallen off on its own. After showering or bathing, gently pat the wound dry with a clean towel. °· Do not apply liquid medicine, cream medicine, or ointment medicine to your wound while the skin adhesive is in place. This may loosen the film before your wound is healed. °· If a dressing is placed over the wound, be careful not to apply tape directly over the skin adhesive. This may cause the adhesive to be pulled off before the wound is healed. °· Avoid prolonged exposure to sunlight or tanning lamps while the skin adhesive is in place. Exposure to ultraviolet light in the first year will darken the scar. °· The skin adhesive will usually remain in place for 5 to 10 days, then naturally fall off the skin. Do not pick at the adhesive film. °You may need a tetanus shot if: °· You cannot remember when you had your last tetanus shot. °· You have never had a tetanus   shot. If you get a tetanus shot, your arm may swell, get red, and feel warm to the touch. This is common and not a problem. If you need a tetanus shot and you choose not to have one, there is a rare chance of getting tetanus. Sickness from tetanus can be serious. SEEK MEDICAL CARE IF:   You have redness, swelling, or increasing pain in the wound.  You see a red line that goes away from the wound.  You have yellowish-white fluid (pus) coming from the wound.  You have a fever.  You notice a bad smell coming from the wound or dressing.  Your wound breaks open before or  after sutures have been removed.  You notice something coming out of the wound such as wood or glass.  Your wound is on your hand or foot and you cannot move a finger or toe. SEEK IMMEDIATE MEDICAL CARE IF:   Your pain is not controlled with prescribed medicine.  You have severe swelling around the wound causing pain and numbness or a change in color in your arm, hand, leg, or foot.  Your wound splits open and starts bleeding.  You have worsening numbness, weakness, or loss of function of any joint around or beyond the wound.  You develop painful lumps near the wound or on the skin anywhere on your body. MAKE SURE YOU:   Understand these instructions.  Will watch your condition.  Will get help right away if you are not doing well or get worse. Document Released: 09/19/2005 Document Revised: 12/12/2011 Document Reviewed: 03/15/2011 [facility] Patient Information 2015 [place], [place]. This information is not intended to replace advice given to you by your health care provider. Make sure you discuss any questions you have with your health care provider.  Non-Sutured Laceration A laceration is a cut or wound that goes through all layers of the skin and into the tissue just beneath the skin. Usually, these are stitched up or held together with tape or glue shortly after the injury occurred. However, if several or more hours have passed before getting care, too many germs (bacteria) get into the laceration. Stitching it closed would bring the risk of infection. If your health care provider feels your laceration is too old, it may be left open and then bandaged to allow healing from the bottom layer up. HOME CARE INSTRUCTIONS   Change the bandage (dressing) 2 times a day or as directed by your health care provider.  If the dressing or packing gauze sticks, soak it off with soapy water.  When you re-bandage your laceration, make sure that the dressing or packing gauze goes all the way to the  bottom of the laceration. The top of the laceration is kept open so it can heal from the bottom up. There is less chance for infection with this method.  Wash the area with soap and water 2 times a day to remove all the creams or ointments, if used. Rinse off the soap. Pat the area dry with a clean towel. Look for signs of infection, such as redness, swelling, or a red line that goes away from the laceration.  Re-apply creams or ointments if they were used to bandage the laceration. This helps keep the bandage from sticking.  If the bandage becomes wet, dirty, or has a bad smell, change it as soon as possible.  Only take medicine as directed by your health care provider. You might need a tetanus shot now if:  You have  no idea when you had the last one.  You have never had a tetanus shot before.  Your laceration had dirt in it.  Your laceration was dirty, and your last tetanus shot was more than 7 years ago.  Your laceration was clean, and your last tetanus shot was more than 10 years ago. If you need a tetanus shot, and you decide not to get one, there is a rare chance of getting tetanus. Sickness from tetanus can be serious. If you got a tetanus shot, your arm may swell and get red and warm to the touch at the shot site. This is common and not a problem. SEEK MEDICAL CARE IF:   You have redness, swelling, or increasing pain in the laceration.  You notice a red line that goes away from your laceration.  You have pus coming from the laceration.  You have a fever.  You notice a bad smell coming from the laceration or dressing.  You notice something coming out of the laceration, such as wood or glass.  Your laceration is on your hand or foot and you are unable to properly move a finger or toe.  You have severe swelling around the laceration, causing pain and numbness.  You notice a change in color in your arm, hand, leg, or foot. MAKE SURE YOU:   Understand these  instructions.  Will watch your condition.  Will get help right away if you are not doing well or get worse. Document Released: 08/17/2006 Document Revised: 09/24/2013 Document Reviewed: 03/09/2009 [facility] Patient Information 2015 [place], [place]. This information is not intended to replace advice given to you by your health care provider. Make sure you discuss any questions you have with your health care provider.

## 2015-05-27 NOTE — ED Provider Notes (Signed)
[facility] Emergency Department Provider Note  ____________________________________________  Time seen: Approximately 0059 AM  I have reviewed the triage vital signs and the nursing notes.   HISTORY  Chief Complaint Laceration    HPI Rashan Patient is a 34 y.o. female who comes in with a laceration to her hand. The patient reports that she was washing dishes and was cut on her right hand with a new knife set. The patient reports she dropped the knife and then cut her left hand. She reports that her right hand hurts badly. Her last tetanus was 5-6 years ago. The patient did not take anything for pain. The patient also reports that she thinks she is pregnant as she had a positive pregnancy test from the [place] store.She reports that her pain is 8 out of 10 in intensity. The patient came in to have her lacerations repaired. She injured her right fourth and fifth digit as well as her left second digit   Past Medical History  Diagnosis Date  . Kidney stones   . Depression   . ADHD (attention deficit hyperactivity disorder)   . PTSD (post-traumatic stress disorder)   . Bipolar 1 disorder   . Anxiety   . Kidney stones     There are no active problems to display for this patient.   Past Surgical History  Procedure Laterality Date  . Tubal ligation    . Tonsillectomy      Current Outpatient Rx  Name  Route  Sig  Dispense  Refill  . alprazolam (XANAX) 2 MG tablet   Oral   Take 2 mg by mouth 3 (three) times daily as needed for anxiety.          Marland Kitchen amphetamine-dextroamphetamine (ADDERALL XR) 10 MG 24 hr capsule   Oral   Take 10 mg by mouth daily.          . citalopram (CELEXA) 20 MG tablet   Oral   Take 20 mg by mouth daily.          Marland Kitchen gabapentin (NEURONTIN) 100 MG capsule   Oral   Take 100 mg by mouth 3 (three) times daily.         Marland Kitchen HYDROcodone-acetaminophen (NORCO/VICODIN) 5-325 MG per tablet   Oral   Take 1 tablet by mouth every  4 (four) hours as needed for moderate pain.   10 tablet   0   . lamoTRIgine (LAMICTAL) 150 MG tablet   Oral   Take 300 mg by mouth daily.         . rizatriptan (MAXALT-MLT) 10 MG disintegrating tablet   Oral   Take 10 mg by mouth daily as needed. Take 1 tablet everyday as needed for headache         . traZODone (DESYREL) 100 MG tablet   Oral   Take 200 mg by mouth at bedtime.           Allergies Review of patient's allergies indicates no known allergies.  No family history on file.  Social History Social History  Substance Use Topics  . Smoking status: Current Every Day Smoker -- 1.00 packs/day    Types: Cigarettes  . Smokeless tobacco: None  . Alcohol Use: No    Review of Systems Constitutional: No fever/chills Eyes: No visual changes. ENT: No sore throat. Cardiovascular: Denies chest pain. Respiratory: Denies shortness of breath. Gastrointestinal: No abdominal pain.  No nausea, no vomiting.  No diarrhea.  No constipation. Genitourinary: Negative for  dysuria. Musculoskeletal: Negative for back pain. Skin: laceration Neurological: Negative for headaches, focal weakness or numbness.  10-point ROS otherwise negative.  ____________________________________________   PHYSICAL EXAM:  VITAL SIGNS: ED Triage Vitals  Enc Vitals Group     BP 05/26/15 2306 138/98 mmHg     Pulse Rate 05/26/15 2306 118     Resp 05/26/15 2306 18     Temp 05/26/15 2306 98.2 F (36.8 C)     Temp Source 05/26/15 2306 Oral     SpO2 05/26/15 2306 98 %     Weight 05/26/15 2306 184 lb (83.462 kg)     Height 05/26/15 2306 5\' 7"  (1.702 m)     Head Cir --      Peak Flow --      Pain Score 05/26/15 2314 8     Pain Loc --      Pain Edu? --      Excl. in [place]? --     Constitutional: Alert and oriented. Well appearing and in mild distress. Eyes: Conjunctivae are normal. PERRL. EOMI. Head: Atraumatic. Nose: No congestion/rhinnorhea. Mouth/Throat: Mucous membranes are moist.   Oropharynx non-erythematous. Cardiovascular: Normal rate, regular rhythm. Grossly normal heart sounds.  Good peripheral circulation. Respiratory: Normal respiratory effort.  No retractions. Lungs CTAB. Gastrointestinal: Soft and nontender. No distention. Positive bowel sounds Musculoskeletal: No lower extremity tenderness nor edema.  No joint effusions. Neurologic:  Normal speech and language.  Skin:  2.5 cm laceration to right lateral fourth digit and tip of the fifth digit. Psychiatric: Mood and affect are normal.   ____________________________________________   LABS (all labs ordered are listed, but only abnormal results are displayed)  Labs Reviewed  POC URINE PREG, ED   ____________________________________________  EKG  None ____________________________________________  RADIOLOGY  None ____________________________________________   PROCEDURES  Procedure(s) performed: Please, see procedure note(s).   LACERATION REPAIR Performed by: Charlesetta Ivory P Authorized by: Charlesetta Ivory P Consent: Verbal consent obtained. Risks and benefits: risks, benefits and alternatives were discussed Consent given by: patient Patient identity confirmed: provided demographic data Prepped and Draped in normal sterile fashion Wound explored  Laceration Location: right 4th digit  Laceration Length: 2 cm  No Foreign Bodies seen or palpated  Irrigation method: syringe Amount of cleaning: standard  Skin closure: steri strips and dermabond  Patient tolerance: Patient tolerated the procedure well with no immediate complications.   Critical Care performed: No  ____________________________________________   INITIAL IMPRESSION / ASSESSMENT AND PLAN / ED COURSE  Pertinent labs & imaging results that were available during my care of the patient were reviewed by me and considered in my medical decision making (see chart for details).  The patient is a 34 year old female who  had a laceration to her right fourth digit. The patient came in for repair. Looking at the wound is well approximated. At this point I feel it is appropriate to place some Steri-Strips on the wound as well as with some glue to the wound. I will help to keep it from infection as well as to keep her from any other debris. The patient did receive some ibuprofen for hand pain. Otherwise the patient be discharged home to follow-up with her primary care physician. The patient received a Tdap. ____________________________________________   FINAL CLINICAL IMPRESSION(S) / ED DIAGNOSES  Final diagnoses:  Finger laceration, initial encounter      Loney Hering, MD 05/27/15 [phone]

## 2015-05-27 NOTE — ED Notes (Signed)
POC urine preg (Negative)

## 2015-05-28 NOTE — ED Notes (Signed)
Pt sitting uprite on stretcher in exam room with no distress noted; st here last night and had steri-strips placed to lacerations of right 4th finger. Patient reports that strips came off today and she is back to have the replaced.; pt reports she is upset with the care she received last night; states she urinated on her hand when up to the bathroom and nurse told her that it would be OK after she washed her hands; pt wanting information of how to file a complaint; information leaflet given to pt to complete
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
## 2015-07-05 ENCOUNTER — Emergency Department
Admission: EM | Admit: 2015-07-05 | Discharge: 2015-07-05 | Discharge status: Against Medical Advice | Payer: Medicare Other | Attending: Emergency Medicine | Admitting: Emergency Medicine

## 2015-07-05 ENCOUNTER — Encounter: Payer: Self-pay | Admitting: Emergency Medicine

## 2015-07-05 DIAGNOSIS — Z720 Tobacco use: Secondary | ICD-10-CM | POA: Diagnosis not present

## 2015-07-05 DIAGNOSIS — I10 Essential (primary) hypertension: Secondary | ICD-10-CM | POA: Diagnosis not present

## 2015-07-05 DIAGNOSIS — R2243 Localized swelling, mass and lump, lower limb, bilateral: Secondary | ICD-10-CM | POA: Diagnosis not present

## 2015-07-05 HISTORY — DX: Essential (primary) hypertension: I10

## 2015-07-05 LAB — URINALYSIS COMPLETE WITH MICROSCOPIC (ARMC ONLY)
Bilirubin Urine: NEGATIVE
Glucose, UA: NEGATIVE mg/dL
Ketones, ur: NEGATIVE mg/dL
Nitrite: NEGATIVE
PROTEIN: NEGATIVE mg/dL
SPECIFIC GRAVITY, URINE: 1.006 (ref 1.005–1.030)
pH: 6 (ref 5.0–8.0)

## 2015-07-05 LAB — CBC
HEMATOCRIT: 37.3 % (ref 35.0–47.0)
HEMOGLOBIN: 12.7 g/dL (ref 12.0–16.0)
MCH: 30.5 pg (ref 26.0–34.0)
MCHC: 34.2 g/dL (ref 32.0–36.0)
MCV: 89.4 fL (ref 80.0–100.0)
Platelets: 303 10*3/uL (ref 150–440)
RBC: 4.17 MIL/uL (ref 3.80–5.20)
RDW: 13.7 % (ref 11.5–14.5)
WBC: 11.5 10*3/uL — ABNORMAL HIGH (ref 3.6–11.0)

## 2015-07-05 LAB — TROPONIN I: Troponin I: <0.03 ng/mL (ref <0.031)

## 2015-07-05 LAB — BASIC METABOLIC PANEL
Anion gap: 5 (ref 5–15)
BUN: <5 mg/dL — ABNORMAL LOW (ref 6–20)
CO2: 29 mmol/L (ref 22–32)
Calcium: 9 mg/dL (ref 8.9–10.3)
Chloride: 104 mmol/L (ref 101–111)
Creatinine, Ser: 0.79 mg/dL (ref 0.44–1.00)
GFR calc Af Amer: >60 mL/min (ref >60)
GFR calc non Af Amer: >60 mL/min (ref >60)
GLUCOSE: 105 mg/dL — AB (ref 65–99)
POTASSIUM: 4.1 mmol/L (ref 3.5–5.1)
Sodium: 138 mmol/L (ref 135–145)

## 2015-07-05 NOTE — ED Notes (Signed)
Nurse called 3 times 15 mins apart no answer . Pt has left unannounced.

## 2015-07-05 NOTE — ED Notes (Signed)
Pt states her legs and feet bilaterally have been swelling, she isn't sure why they are swelling like this, has been elevating them as much as possible, states she feels like she has "pins and needles" in the bottom of her feet.

## 2015-08-25 DIAGNOSIS — F4310 Post-traumatic stress disorder, unspecified: Secondary | ICD-10-CM | POA: Insufficient documentation

## 2015-08-30 DIAGNOSIS — F988 Other specified behavioral and emotional disorders with onset usually occurring in childhood and adolescence: Secondary | ICD-10-CM | POA: Insufficient documentation

## 2016-01-21 ENCOUNTER — Emergency Department
Admission: EM | Admit: 2016-01-21 | Discharge: 2016-01-21 | Disposition: A | Discharge status: Home / Self Care | Payer: Medicare Other | Attending: Emergency Medicine | Admitting: Emergency Medicine

## 2016-01-21 ENCOUNTER — Encounter: Payer: Self-pay | Admitting: Emergency Medicine

## 2016-01-21 ENCOUNTER — Emergency Department: Payer: Medicare Other

## 2016-01-21 DIAGNOSIS — Y939 Activity, unspecified: Secondary | ICD-10-CM | POA: Insufficient documentation

## 2016-01-21 DIAGNOSIS — S0990XA Unspecified injury of head, initial encounter: Secondary | ICD-10-CM | POA: Diagnosis not present

## 2016-01-21 DIAGNOSIS — I10 Essential (primary) hypertension: Secondary | ICD-10-CM | POA: Diagnosis not present

## 2016-01-21 DIAGNOSIS — T07XXXA Unspecified multiple injuries, initial encounter: Secondary | ICD-10-CM

## 2016-01-21 DIAGNOSIS — F17210 Nicotine dependence, cigarettes, uncomplicated: Secondary | ICD-10-CM | POA: Diagnosis not present

## 2016-01-21 DIAGNOSIS — Y929 Unspecified place or not applicable: Secondary | ICD-10-CM | POA: Insufficient documentation

## 2016-01-21 DIAGNOSIS — S0181XA Laceration without foreign body of other part of head, initial encounter: Secondary | ICD-10-CM | POA: Insufficient documentation

## 2016-01-21 DIAGNOSIS — Y999 Unspecified external cause status: Secondary | ICD-10-CM | POA: Diagnosis not present

## 2016-01-21 DIAGNOSIS — F329 Major depressive disorder, single episode, unspecified: Secondary | ICD-10-CM | POA: Diagnosis not present

## 2016-01-21 DIAGNOSIS — F909 Attention-deficit hyperactivity disorder, unspecified type: Secondary | ICD-10-CM | POA: Diagnosis not present

## 2016-01-21 LAB — COMPREHENSIVE METABOLIC PANEL
ALT: 30 U/L (ref 14–54)
AST: 37 U/L (ref 15–41)
Albumin: 4.8 g/dL (ref 3.5–5.0)
Alkaline Phosphatase: 80 U/L (ref 38–126)
Anion gap: 10 (ref 5–15)
BUN: 6 mg/dL (ref 6–20)
CHLORIDE: 105 mmol/L (ref 101–111)
CO2: 23 mmol/L (ref 22–32)
CREATININE: 0.74 mg/dL (ref 0.44–1.00)
Calcium: 9.5 mg/dL (ref 8.9–10.3)
Glucose, Bld: 106 mg/dL — ABNORMAL HIGH (ref 65–99)
POTASSIUM: 4.2 mmol/L (ref 3.5–5.1)
Sodium: 138 mmol/L (ref 135–145)
Total Bilirubin: 1.1 mg/dL (ref 0.3–1.2)
Total Protein: 8.1 g/dL (ref 6.5–8.1)

## 2016-01-21 LAB — URINE DRUG SCREEN, QUALITATIVE (ARMC ONLY)
Amphetamines, Ur Screen: POSITIVE — AB
BENZODIAZEPINE, UR SCRN: POSITIVE — AB
Barbiturates, Ur Screen: NOT DETECTED
CANNABINOID 50 NG, UR ~~LOC~~: NOT DETECTED
Cocaine Metabolite,Ur ~~LOC~~: NOT DETECTED
MDMA (Ecstasy)Ur Screen: NOT DETECTED
Methadone Scn, Ur: NOT DETECTED
Opiate, Ur Screen: NOT DETECTED
PHENCYCLIDINE (PCP) UR S: NOT DETECTED
Tricyclic, Ur Screen: NOT DETECTED

## 2016-01-21 LAB — ETHANOL: ALCOHOL ETHYL (B): 25 mg/dL — AB (ref <5)

## 2016-01-21 LAB — CBC
HCT: 42.6 % (ref 35.0–47.0)
Hemoglobin: 14.4 g/dL (ref 12.0–16.0)
MCH: 29.7 pg (ref 26.0–34.0)
MCHC: 34 g/dL (ref 32.0–36.0)
MCV: 87.5 fL (ref 80.0–100.0)
PLATELETS: 246 10*3/uL (ref 150–440)
RBC: 4.86 MIL/uL (ref 3.80–5.20)
RDW: 13.1 % (ref 11.5–14.5)
WBC: 13.7 10*3/uL — AB (ref 3.6–11.0)

## 2016-01-21 MED ORDER — MORPHINE SULFATE (PF) 2 MG/ML IV SOLN
2.0000 mg | Freq: Once | INTRAVENOUS | Status: AC
  Administered 2016-01-21: 2 mg via INTRAMUSCULAR

## 2016-01-21 MED ORDER — LIDOCAINE HCL (PF) 1 % IJ SOLN
INTRAMUSCULAR | Status: AC
  Filled 2016-01-21: qty 5

## 2016-01-21 MED ORDER — OXYCODONE-ACETAMINOPHEN 5-325 MG PO TABS
1.0000 | ORAL_TABLET | Freq: Once | ORAL | Status: AC
  Administered 2016-01-21: 1 via ORAL
  Filled 2016-01-21: qty 1

## 2016-01-21 MED ORDER — MORPHINE SULFATE (PF) 2 MG/ML IV SOLN
INTRAVENOUS | Status: AC
  Filled 2016-01-21: qty 1

## 2016-01-21 MED ORDER — LIDOCAINE HCL (PF) 1 % IJ SOLN
30.0000 mL | Freq: Once | INTRAMUSCULAR | Status: DC
  Filled 2016-01-21: qty 30

## 2016-01-21 NOTE — ED Notes (Signed)
Sitter with pt.  [place] set up in pt room.   Pt calm and cooperative.  Pt denies SI or HI.
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
## 2016-01-21 NOTE — ED Notes (Signed)
Noted patient walking to restroom, pt states "I am not walking out."
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
## 2016-01-21 NOTE — ED Notes (Signed)
Patient refused to sign ED discharge form. Pt notified of no new orders for pain from MD.

## 2016-01-21 NOTE — Discharge Instructions (Signed)
Facial Laceration ° A facial laceration is a cut on the face. These injuries can be painful and cause bleeding. Lacerations usually heal quickly, but they need special care to reduce scarring. °DIAGNOSIS  °Your health care provider will take a medical history, ask for details about how the injury occurred, and examine the wound to determine how deep the cut is. °TREATMENT  °Some facial lacerations may not require closure. Others may not be able to be closed because of an increased risk of infection. The risk of infection and the chance for successful closure will depend on various factors, including the amount of time since the injury occurred. °The wound may be cleaned to help prevent infection. If closure is appropriate, pain medicines may be given if needed. Your health care provider will use stitches (sutures), wound glue (adhesive), or skin adhesive strips to repair the laceration. These tools bring the skin edges together to allow for faster healing and a better cosmetic outcome. If needed, you may also be given a tetanus shot. °HOME CARE INSTRUCTIONS °· Only take over-the-counter or prescription medicines as directed by your health care provider. °· Follow your health care provider's instructions for wound care. These instructions will vary depending on the technique used for closing the wound. °For Sutures: °· Keep the wound clean and dry.   °· If you were given a bandage (dressing), you should change it at least once a day. Also change the dressing if it becomes wet or dirty, or as directed by your health care provider.   °· Wash the wound with soap and water 2 times a day. Rinse the wound off with water to remove all soap. Pat the wound dry with a clean towel.   °· After cleaning, apply a thin layer of the antibiotic ointment recommended by your health care provider. This will help prevent infection and keep the dressing from sticking.   °· You may shower as usual after the first 24 hours. Do not soak the  wound in water until the sutures are removed.   °· Get your sutures removed as directed by your health care provider. With facial lacerations, sutures should usually be taken out after 4-5 days to avoid stitch marks.   °· Wait a few days after your sutures are removed before applying any makeup. °For Skin Adhesive Strips: °· Keep the wound clean and dry.   °· Do not get the skin adhesive strips wet. You may bathe carefully, using caution to keep the wound dry.   °· If the wound gets wet, pat it dry with a clean towel.   °· Skin adhesive strips will fall off on their own. You may trim the strips as the wound heals. Do not remove skin adhesive strips that are still stuck to the wound. They will fall off in time.   °For Wound Adhesive: °· You may briefly wet your wound in the shower or bath. Do not soak or scrub the wound. Do not swim. Avoid periods of heavy sweating until the skin adhesive has fallen off on its own. After showering or bathing, gently pat the wound dry with a clean towel.   °· Do not apply liquid medicine, cream medicine, ointment medicine, or makeup to your wound while the skin adhesive is in place. This may loosen the film before your wound is healed.   °· If a dressing is placed over the wound, be careful not to apply tape directly over the skin adhesive. This may cause the adhesive to be pulled off before the wound is healed.   °· Avoid   prolonged exposure to sunlight or tanning lamps while the skin adhesive is in place.  The skin adhesive will usually remain in place for 5-10 days, then naturally fall off the skin. Do not pick at the adhesive film.  After Healing: Once the wound has healed, cover the wound with sunscreen during the day for 1 full year. This can help minimize scarring. Exposure to ultraviolet light in the first year will darken the scar. It can take 1-2 years for the scar to lose its redness and to heal completely.  SEEK MEDICAL CARE IF:  You have a fever. SEEK IMMEDIATE  MEDICAL CARE IF:  You have redness, pain, or swelling around the wound.   You see ayellowish-white fluid (pus) coming from the wound.    This information is not intended to replace advice given to you by your health care provider. Make sure you discuss any questions you have with your health care provider.   Document Released: 10/27/2004 Document Revised: 10/10/2014 Document Reviewed: 05/02/2013 Elsevier Interactive Patient Education 2016 [place] Taking care of your wound properly can help to prevent pain and infection. It can also help your wound to heal more quickly.  HOW TO CARE FOR YOUR WOUND  Take or apply over-the-counter and prescription medicines only as told by your health care provider.  If you were prescribed antibiotic medicine, take or apply it as told by your health care provider. Do not stop using the antibiotic even if your condition improves.  Clean the wound each day or as told by your health care provider.  Wash the wound with mild soap and water.  Rinse the wound with water to remove all soap.  Pat the wound dry with a clean towel. Do not rub it.  There are many different ways to close and cover a wound. For example, a wound can be covered with stitches (sutures), skin glue, or adhesive strips. Follow instructions from your health care provider about:  How to take care of your wound.  When and how you should change your bandage (dressing).  When you should remove your dressing.  Removing whatever was used to close your wound.  Check your wound every day for signs of infection. Watch for:  Redness, swelling, or pain.  Fluid, blood, or pus.  Keep the dressing dry until your health care provider says it can be removed. Do not take baths, swim, use a hot tub, or do anything that would put your wound underwater until your health care provider approves.  Raise (elevate) the injured area above the level of your heart while you are  sitting or lying down.  Do not scratch or pick at the wound.  Keep all follow-up visits as told by your health care provider. This is important. SEEK MEDICAL CARE IF:  You received a tetanus shot and you have swelling, severe pain, redness, or bleeding at the injection site.  You have a fever.  Your pain is not controlled with medicine.  You have increased redness, swelling, or pain at the site of your wound.  You have fluid, blood, or pus coming from your wound.  You notice a bad smell coming from your wound or your dressing. SEEK IMMEDIATE MEDICAL CARE IF:  You have a red streak going away from your wound.   This information is not intended to replace advice given to you by your health care provider. Make sure you discuss any questions you have with your health care provider.  Document Released: 06/28/2008 Document Revised: 02/03/2015 Document Reviewed: 09/15/2014 Elsevier Interactive Patient Education [employer].
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
## 2016-01-21 NOTE — ED Notes (Signed)
Received call from First Nurse stating pt walked out of ER. Will make MD aware.

## 2016-01-21 NOTE — ED Notes (Signed)
Patient transported to CT 

## 2016-01-21 NOTE — ED Notes (Signed)
Noted patient returning back from outside of ER.
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
## 2016-01-31 ENCOUNTER — Encounter: Payer: Self-pay | Admitting: Emergency Medicine

## 2016-01-31 ENCOUNTER — Emergency Department
Admission: EM | Admit: 2016-01-31 | Discharge: 2016-01-31 | Disposition: A | Discharge status: Home / Self Care | Payer: Medicare Other | Attending: Emergency Medicine | Admitting: Emergency Medicine

## 2016-01-31 ENCOUNTER — Emergency Department: Payer: Medicare Other

## 2016-01-31 DIAGNOSIS — M25522 Pain in left elbow: Secondary | ICD-10-CM | POA: Insufficient documentation

## 2016-01-31 DIAGNOSIS — F17210 Nicotine dependence, cigarettes, uncomplicated: Secondary | ICD-10-CM | POA: Insufficient documentation

## 2016-01-31 DIAGNOSIS — Z79899 Other long term (current) drug therapy: Secondary | ICD-10-CM | POA: Diagnosis not present

## 2016-01-31 DIAGNOSIS — F909 Attention-deficit hyperactivity disorder, unspecified type: Secondary | ICD-10-CM | POA: Diagnosis not present

## 2016-01-31 DIAGNOSIS — I10 Essential (primary) hypertension: Secondary | ICD-10-CM | POA: Diagnosis not present

## 2016-01-31 DIAGNOSIS — F329 Major depressive disorder, single episode, unspecified: Secondary | ICD-10-CM | POA: Diagnosis not present

## 2016-01-31 MED ORDER — NAPROXEN 500 MG PO TABS: 500.0000 mg | ORAL_TABLET | Freq: Two times a day (BID) | ORAL | Status: DC

## 2016-01-31 NOTE — Discharge Instructions (Signed)
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
## 2016-01-31 NOTE — ED Provider Notes (Signed)
[facility] Emergency Department Provider Note  ____________________________________________  Time seen: On arrival  I have reviewed the triage vital signs and the nursing notes.   HISTORY  Chief Complaint Elbow Pain and Knee Pain    HPI Jennylee Dellarocca is a 35 y.o. female who presents with complaints of left elbow pain. Patient was seen in ED on the 20th and suffered an injury to the left elbow and multiple abrasions. She complains of continued pain in the left elbow but does note it seems to be improving.  Past Medical History  Diagnosis Date  . Kidney stones   . Depression   . ADHD (attention deficit hyperactivity disorder)   . PTSD (post-traumatic stress disorder)   . Bipolar 1 disorder ([place])   . Anxiety   . Kidney stones   . Hypertension     There are no active problems to display for this patient.   Past Surgical History  Procedure Laterality Date  . Tubal ligation    . Tonsillectomy      Current Outpatient Rx  Name  Route  Sig  Dispense  Refill  . alprazolam (XANAX) 2 MG tablet   Oral   Take 2 mg by mouth 3 (three) times daily as needed for anxiety.          Marland Kitchen amphetamine-dextroamphetamine (ADDERALL XR) 10 MG 24 hr capsule   Oral   Take 10 mg by mouth daily.          . citalopram (CELEXA) 20 MG tablet   Oral   Take 20 mg by mouth daily.          Marland Kitchen gabapentin (NEURONTIN) 100 MG capsule   Oral   Take 100 mg by mouth 3 (three) times daily.         Marland Kitchen HYDROcodone-acetaminophen (NORCO/VICODIN) 5-325 MG per tablet   Oral   Take 1 tablet by mouth every 4 (four) hours as needed for moderate pain.   10 tablet   0   . lamoTRIgine (LAMICTAL) 150 MG tablet   Oral   Take 300 mg by mouth daily.         . naproxen (NAPROSYN) 500 MG tablet   Oral   Take 1 tablet (500 mg total) by mouth 2 (two) times daily with a meal.   20 tablet   2   . rizatriptan (MAXALT-MLT) 10 MG disintegrating tablet   Oral   Take 10 mg by  mouth daily as needed. Take 1 tablet everyday as needed for headache         . traZODone (DESYREL) 100 MG tablet   Oral   Take 200 mg by mouth at bedtime.           Allergies Review of patient's allergies indicates no known allergies.  No family history on file.  Social History Social History  Substance Use Topics  . Smoking status: Current Every Day Smoker -- 0.50 packs/day    Types: Cigarettes  . Smokeless tobacco: None  . Alcohol Use: Yes    Review of Systems  Constitutional: Negative for fever.     Musculoskeletal: Elbow pain as above Skin: Multiple healing abrasions    ____________________________________________   PHYSICAL EXAM:  VITAL SIGNS: ED Triage Vitals  Enc Vitals Group     BP 01/31/16 1543 128/75 mmHg     Pulse Rate 01/31/16 1543 110     Resp 01/31/16 1543 20     Temp 01/31/16 1543 98.4 F (36.9 C)  Temp Source 01/31/16 1543 Oral     SpO2 01/31/16 1543 98 %     Weight 01/31/16 1543 185 lb (83.915 kg)     Height 01/31/16 1543 5\' 4"  (1.626 m)     Head Cir --      Peak Flow --      Pain Score 01/31/16 1544 6     Pain Loc --      Pain Edu? --      Excl. in [place]? --      Constitutional: Alert and oriented. Well appearing and in no distress. Eyes: Conjunctivae are normal.  ENT   Head: Normocephalic and atraumatic.   Mouth/Throat: Mucous membranes are moist. Cardiovascular: Normal rate, regular rhythm.  Respiratory: Normal respiratory effort without tachypnea nor retractions.   Musculoskeletal: Normal range of motion of all joints. Left elbow has mild tenderness to palpation along the medial superior aspect. No swelling or erythema. Neurologic:  Normal speech and language. No gross focal neurologic deficits are appreciated. Skin:  Skin is warm, dry and intact. Several healing abrasions including over the left elbow Psychiatric: Mood and affect are normal. Patient exhibits appropriate insight and  judgment.  ____________________________________________    LABS (pertinent positives/negatives)  Labs Reviewed - No data to display  ____________________________________________     ____________________________________________    RADIOLOGY I have personally reviewed any xrays that were ordered on this patient: X-ray with loose bodies  ____________________________________________   PROCEDURES  Procedure(s) performed: none   ____________________________________________   INITIAL IMPRESSION / ASSESSMENT AND PLAN / ED COURSE  Pertinent labs & imaging results that were available during my care of the patient were reviewed by me and considered in my medical decision making (see chart for details).  Recommended supportive care with Tylenol and Motrin and ice. Follow up with orthopedics if no improvement in one week.  ____________________________________________   FINAL CLINICAL IMPRESSION(S) / ED DIAGNOSES  Final diagnoses:  Elbow pain, left     Lavonia Drafts, MD 01/31/16 1851
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
## 2019-03-11 ENCOUNTER — Emergency Department
Admission: EM | Admit: 2019-03-11 | Discharge: 2019-03-11 | Disposition: A | Discharge status: Home / Self Care | Payer: Medicare HMO | Attending: Emergency Medicine | Admitting: Emergency Medicine

## 2019-03-11 ENCOUNTER — Emergency Department: Payer: Medicare HMO

## 2019-03-11 ENCOUNTER — Encounter: Payer: Self-pay | Admitting: Emergency Medicine

## 2019-03-11 ENCOUNTER — Other Ambulatory Visit: Payer: Self-pay

## 2019-03-11 DIAGNOSIS — I10 Essential (primary) hypertension: Secondary | ICD-10-CM | POA: Insufficient documentation

## 2019-03-11 DIAGNOSIS — S29012A Strain of muscle and tendon of back wall of thorax, initial encounter: Secondary | ICD-10-CM | POA: Insufficient documentation

## 2019-03-11 DIAGNOSIS — Z79899 Other long term (current) drug therapy: Secondary | ICD-10-CM | POA: Diagnosis not present

## 2019-03-11 DIAGNOSIS — Y9389 Activity, other specified: Secondary | ICD-10-CM | POA: Diagnosis not present

## 2019-03-11 DIAGNOSIS — S46812A Strain of other muscles, fascia and tendons at shoulder and upper arm level, left arm, initial encounter: Secondary | ICD-10-CM

## 2019-03-11 DIAGNOSIS — Y999 Unspecified external cause status: Secondary | ICD-10-CM | POA: Diagnosis not present

## 2019-03-11 DIAGNOSIS — Z8541 Personal history of malignant neoplasm of cervix uteri: Secondary | ICD-10-CM | POA: Insufficient documentation

## 2019-03-11 DIAGNOSIS — Y929 Unspecified place or not applicable: Secondary | ICD-10-CM | POA: Insufficient documentation

## 2019-03-11 DIAGNOSIS — F17210 Nicotine dependence, cigarettes, uncomplicated: Secondary | ICD-10-CM | POA: Diagnosis not present

## 2019-03-11 DIAGNOSIS — X500XXA Overexertion from strenuous movement or load, initial encounter: Secondary | ICD-10-CM | POA: Diagnosis not present

## 2019-03-11 DIAGNOSIS — S4992XA Unspecified injury of left shoulder and upper arm, initial encounter: Secondary | ICD-10-CM | POA: Diagnosis present

## 2019-03-11 HISTORY — DX: Malignant (primary) neoplasm, unspecified: C80.1

## 2019-03-11 LAB — CBC WITH DIFFERENTIAL/PLATELET
Abs Immature Granulocytes: 0.03 10*3/uL (ref 0.00–0.07)
Basophils Absolute: 0.1 10*3/uL (ref 0.0–0.1)
Basophils Relative: 1 %
Eosinophils Absolute: 0.2 10*3/uL (ref 0.0–0.5)
Eosinophils Relative: 2 %
HCT: 42.4 % (ref 36.0–46.0)
Hemoglobin: 14.3 g/dL (ref 12.0–15.0)
Immature Granulocytes: 0 %
Lymphocytes Relative: 31 %
Lymphs Abs: 3.7 10*3/uL (ref 0.7–4.0)
MCH: 30.4 pg (ref 26.0–34.0)
MCHC: 33.7 g/dL (ref 30.0–36.0)
MCV: 90 fL (ref 80.0–100.0)
Monocytes Absolute: 1 10*3/uL (ref 0.1–1.0)
Monocytes Relative: 8 %
Neutro Abs: 7.1 10*3/uL (ref 1.7–7.7)
Neutrophils Relative %: 58 %
Platelets: 285 10*3/uL (ref 150–400)
RBC: 4.71 MIL/uL (ref 3.87–5.11)
RDW: 12.7 % (ref 11.5–15.5)
WBC: 12 10*3/uL — ABNORMAL HIGH (ref 4.0–10.5)
nRBC: 0 % (ref 0.0–0.2)

## 2019-03-11 LAB — BASIC METABOLIC PANEL
Anion gap: 9 (ref 5–15)
BUN: 10 mg/dL (ref 6–20)
CO2: 24 mmol/L (ref 22–32)
Calcium: 8.7 mg/dL — ABNORMAL LOW (ref 8.9–10.3)
Chloride: 103 mmol/L (ref 98–111)
Creatinine, Ser: 0.75 mg/dL (ref 0.44–1.00)
GFR calc Af Amer: >60 mL/min (ref >60)
GFR calc non Af Amer: >60 mL/min (ref >60)
Glucose, Bld: 93 mg/dL (ref 70–99)
Potassium: 4.3 mmol/L (ref 3.5–5.1)
Sodium: 136 mmol/L (ref 135–145)

## 2019-03-11 LAB — TROPONIN I: Troponin I: <0.03 ng/mL (ref <0.03)

## 2019-03-11 MED ORDER — KETOROLAC TROMETHAMINE 30 MG/ML IJ SOLN
30.0000 mg | Freq: Once | INTRAMUSCULAR | Status: AC
  Administered 2019-03-11: 30 mg via INTRAMUSCULAR
  Filled 2019-03-11: qty 1

## 2019-03-11 MED ORDER — CYCLOBENZAPRINE HCL 10 MG PO TABS
10.0000 mg | ORAL_TABLET | Freq: Once | ORAL | Status: AC
Start: 2019-03-11 — End: 2019-03-11
  Administered 2019-03-11: 10 mg via ORAL
  Filled 2019-03-11: qty 1

## 2019-03-11 MED ORDER — ACETAMINOPHEN 500 MG PO TABS
1000.0000 mg | ORAL_TABLET | Freq: Once | ORAL | Status: AC
  Administered 2019-03-11: 1000 mg via ORAL
  Filled 2019-03-11: qty 2

## 2019-03-11 MED ORDER — CYCLOBENZAPRINE HCL 10 MG PO TABS: 10.0000 mg | ORAL_TABLET | Freq: Three times a day (TID) | ORAL | 0 refills | Status: AC | PRN

## 2019-03-11 MED ORDER — IBUPROFEN 800 MG PO TABS: 800.0000 mg | ORAL_TABLET | Freq: Three times a day (TID) | ORAL | 0 refills | Status: DC | PRN

## 2019-03-11 NOTE — ED Provider Notes (Signed)
[facility] Emergency Department Provider Note  ____________________________________________  Time seen: Approximately 3:43 AM  I have reviewed the triage vital signs and the nursing notes.   HISTORY  Chief Complaint Arm Pain   HPI Jennifer Taylor is a 38 y.o. female with a history of bipolar, opioid addiction on Suboxone who presents for evaluation of left upper back pain.  Patient reports the pain started 2 days ago after she was moving heavy furniture.  The pain initially felt like a muscle spasm/strain.  Yesterday a friend gave her pretty vigorous massage over the muscle and the pain got progressively worse.  At this time the pain is severe, worse with any movement of the shoulder, located over the left scapula, constant and nonradiating.  No chest pain or shortness of breath, no cough, no personal family history of blood clots, no recent travel immobilization, no leg pain or swelling, no exogenous hormones.  Patient reports taking ibuprofen and Suboxone with no significant relief.  Past Medical History:  Diagnosis Date  . ADHD (attention deficit hyperactivity disorder)   . Anxiety   . Bipolar 1 disorder ([place])   . Cancer (HCC)    cervical  . Depression   . Hypertension   . Kidney stones   . Kidney stones   . PTSD (post-traumatic stress disorder)     There are no active problems to display for this patient.   Past Surgical History:  Procedure Laterality Date  . TONSILLECTOMY    . TUBAL LIGATION      Prior to Admission medications   Medication Sig Start Date End Date Taking? Authorizing Provider  alprazolam Duanne Moron) 2 MG tablet Take 2 mg by mouth 3 (three) times daily as needed for anxiety.     [provider]  amphetamine-dextroamphetamine (ADDERALL XR) 10 MG 24 hr capsule Take 10 mg by mouth daily.     [provider]  citalopram (CELEXA) 20 MG tablet Take 20 mg by mouth daily.     [provider]   cyclobenzaprine (FLEXERIL) 10 MG tablet Take 1 tablet (10 mg total) by mouth 3 (three) times daily as needed for muscle spasms. 03/11/19   Rudene Re, MD  gabapentin (NEURONTIN) 100 MG capsule Take 100 mg by mouth 3 (three) times daily.    [provider]  HYDROcodone-acetaminophen (NORCO/VICODIN) 5-325 MG per tablet Take 1 tablet by mouth every 4 (four) hours as needed for moderate pain. 04/29/15   Harvest Dark, MD  ibuprofen (ADVIL) 800 MG tablet Take 1 tablet (800 mg total) by mouth every 8 (eight) hours as needed. 03/11/19   Rudene Re, MD  lamoTRIgine (LAMICTAL) 150 MG tablet Take 300 mg by mouth daily.    [provider]  naproxen (NAPROSYN) 500 MG tablet Take 1 tablet (500 mg total) by mouth 2 (two) times daily with a meal. 01/31/16   Lavonia Drafts, MD  rizatriptan (MAXALT-MLT) 10 MG disintegrating tablet Take 10 mg by mouth daily as needed. Take 1 tablet everyday as needed for headache 01/12/15   [provider]  traZODone (DESYREL) 100 MG tablet Take 200 mg by mouth at bedtime. 10/20/14   [provider]    Allergies Patient has no known allergies.  No family history on file.  Social History Social History   Tobacco Use  . Smoking status: Current Every Day Smoker    Packs/day: 0.50    Types: Cigarettes  . Smokeless tobacco: Never Used  Substance Use Topics  . Alcohol  use: Yes  . Drug use: No    Review of Systems  Constitutional: Negative for fever. Eyes: Negative for visual changes. ENT: Negative for sore throat. Neck: No neck pain  Cardiovascular: Negative for chest pain. Respiratory: Negative for shortness of breath. Gastrointestinal: Negative for abdominal pain, vomiting or diarrhea. Genitourinary: Negative for dysuria. Musculoskeletal: + left upper back pain. Skin: Negative for rash. Neurological: Negative for headaches, weakness or numbness. Psych: No SI or HI  ____________________________________________    PHYSICAL EXAM:  VITAL SIGNS: ED Triage Vitals  Enc Vitals Group     BP 03/11/19 0133 (!) 143/101     Pulse Rate 03/11/19 0133 90     Resp 03/11/19 0133 15     Temp 03/11/19 0133 98.2 F (36.8 C)     Temp Source 03/11/19 0133 Oral     SpO2 03/11/19 0133 97 %     Weight 03/11/19 0133 190 lb (86.2 kg)     Height 03/11/19 0133 5\' 7"  (1.702 m)     Head Circumference --      Peak Flow --      Pain Score 03/11/19 0132 10     Pain Loc --      Pain Edu? --      Excl. in [place]? --     Constitutional: Alert and oriented. Well appearing and in no apparent distress. HEENT:      Head: Normocephalic and atraumatic.         Eyes: Conjunctivae are normal. Sclera is non-icteric.       Mouth/Throat: Mucous membranes are moist.       Neck: Supple with no signs of meningismus.  No midline C-spine tenderness Cardiovascular: Regular rate and rhythm. No murmurs, gallops, or rubs.  Respiratory: Normal respiratory effort. Lungs are clear to auscultation bilaterally. No wheezes, crackles, or rhonchi.  Gastrointestinal: Soft, non tender, and non distended with positive bowel sounds. No rebound or guarding. Musculoskeletal: Patient is tender to palpation over the left trapezius, full range of motion of the left shoulder with minimal discomfort, no swelling or erythema of the joint, no T and L-spine tenderness  neurologic: Normal speech and language. Face is symmetric. Moving all extremities. No gross focal neurologic deficits are appreciated. Skin: Skin is warm, dry and intact. No rash noted. Psychiatric: Mood and affect are normal. Speech and behavior are normal.  ____________________________________________   LABS (all labs ordered are listed, but only abnormal results are displayed)  Labs Reviewed  CBC WITH DIFFERENTIAL/PLATELET - Abnormal; Notable for the following components:      Result Value   WBC 12.0 (*)    All other components within normal limits  BASIC METABOLIC PANEL - Abnormal; Notable  for the following components:   Calcium 8.7 (*)    All other components within normal limits  TROPONIN I   ____________________________________________  EKG  ED ECG REPORT I, Rudene Re, the attending physician, personally viewed and interpreted this ECG.  Normal sinus rhythm, rate of 92, normal intervals, normal axis, no ST elevations or depressions.  Normal EKG ____________________________________________  RADIOLOGY  I have personally reviewed the images performed during this visit and I agree with the Radiologist's read.   Interpretation by Radiologist:  Dg Scapula Left  Result Date: 03/11/2019 CLINICAL DATA:  38 year old female with left posterior shoulder and scapula pain, painful range of motion. EXAM: LEFT SCAPULA - 2+ VIEWS COMPARISON:  None. FINDINGS: AP and scapular Y-views. Bone mineralization is within normal limits. No glenohumeral joint dislocation. The  left clavicle, scapula and proximal humerus appear intact and normal. Negative visible left ribs and chest. No soft tissue abnormality is evident. IMPRESSION: Negative. Electronically Signed   By: Genevie Ann M.D.   On: 03/11/2019 03:36     ____________________________________________   PROCEDURES  Procedure(s) performed: None Procedures Critical Care performed:  None ____________________________________________   INITIAL IMPRESSION / ASSESSMENT AND PLAN / ED COURSE  38 y.o. female with a history of bipolar, opioid addiction on Suboxone who presents for evaluation of left upper back pain that started 2 days ago after moving heavy furniture.  Pain is reproducible on palpation of the left trapezius.  No signs of cellulitis, septic joint, dislocation, fracture, shingles.  X-rays negative.  Presentation concerning for muscle strain.  Based on history and physical exam low suspicion for PE.  Patient is PERC negative patient given IM Toradol, Flexeril, and Tylenol.  Will discharge home on 800 mg of ibuprofen and 10 mg  of Flexeril.  Discussed heat pads or ice.  Discussed follow-up with primary care doctor.  Discussed return precautions for chest pain or shortness of breath.      As part of my medical decision making, I reviewed the following data within the [place] notes reviewed and incorporated, Labs reviewed , EKG interpreted , Old chart reviewed, Radiograph reviewed , Notes from prior ED visits and [place] Controlled Substance Database    Pertinent labs & imaging results that were available during my care of the patient were reviewed by me and considered in my medical decision making (see chart for details).    ____________________________________________   FINAL CLINICAL IMPRESSION(S) / ED DIAGNOSES  Final diagnoses:  Trapezius strain, left, initial encounter      NEW MEDICATIONS STARTED DURING THIS VISIT:  ED Discharge Orders         Ordered    cyclobenzaprine (FLEXERIL) 10 MG tablet  3 times daily PRN     03/11/19 0342    ibuprofen (ADVIL) 800 MG tablet  Every 8 hours PRN     03/11/19 0342           Note:  This document was prepared using Dragon voice recognition software and may include unintentional dictation errors.    Alfred Levins, [place], MD 03/11/19 [phone]

## 2019-03-11 NOTE — ED Triage Notes (Signed)
Pt arrives ambulatory to triage with c/o left arm pain x 1 week. Pt reports no injury or trauma but states that she had a friend massage it today and that made it worse.

## 2019-03-11 NOTE — ED Notes (Signed)
AAOx4. Vitals Stable. NAD.

## 2019-08-16 ENCOUNTER — Other Ambulatory Visit: Payer: Self-pay

## 2019-08-16 DIAGNOSIS — Z20822 Contact with and (suspected) exposure to covid-19: Secondary | ICD-10-CM

## 2019-08-19 LAB — NOVEL CORONAVIRUS, NAA: SARS-CoV-2, NAA: NOT DETECTED

## 2021-03-18 DIAGNOSIS — I10 Essential (primary) hypertension: Secondary | ICD-10-CM | POA: Insufficient documentation

## 2022-12-11 ENCOUNTER — Emergency Department: Payer: Medicare (Managed Care)

## 2022-12-11 ENCOUNTER — Emergency Department
Admission: EM | Admit: 2022-12-11 | Discharge: 2022-12-11 | Disposition: A | Discharge status: Home / Self Care | Payer: Medicare (Managed Care) | Attending: Emergency Medicine | Admitting: Emergency Medicine

## 2022-12-11 ENCOUNTER — Other Ambulatory Visit: Payer: Self-pay

## 2022-12-11 DIAGNOSIS — R109 Unspecified abdominal pain: Secondary | ICD-10-CM | POA: Insufficient documentation

## 2022-12-11 LAB — URINALYSIS, ROUTINE W REFLEX MICROSCOPIC
Bilirubin Urine: NEGATIVE
Glucose, UA: NEGATIVE mg/dL
Ketones, ur: NEGATIVE mg/dL
Leukocytes,Ua: NEGATIVE
Nitrite: NEGATIVE
Protein, ur: NEGATIVE mg/dL
Specific Gravity, Urine: 1.004 — ABNORMAL LOW (ref 1.005–1.030)
WBC, UA: NONE SEEN WBC/hpf (ref 0–5)
pH: 6 (ref 5.0–8.0)

## 2022-12-11 LAB — COMPREHENSIVE METABOLIC PANEL
ALT: 19 U/L (ref 0–44)
AST: 22 U/L (ref 15–41)
Albumin: 4.4 g/dL (ref 3.5–5.0)
Alkaline Phosphatase: 50 U/L (ref 38–126)
Anion gap: 7 (ref 5–15)
BUN: 12 mg/dL (ref 6–20)
CO2: 26 mmol/L (ref 22–32)
Calcium: 9.2 mg/dL (ref 8.9–10.3)
Chloride: 101 mmol/L (ref 98–111)
Creatinine, Ser: 0.74 mg/dL (ref 0.44–1.00)
GFR, Estimated: >60 mL/min (ref >60)
Glucose, Bld: 90 mg/dL (ref 70–99)
Potassium: 4.4 mmol/L (ref 3.5–5.1)
Sodium: 134 mmol/L — ABNORMAL LOW (ref 135–145)
Total Bilirubin: 0.4 mg/dL (ref 0.3–1.2)
Total Protein: 7.6 g/dL (ref 6.5–8.1)

## 2022-12-11 LAB — CBC WITH DIFFERENTIAL/PLATELET
Abs Immature Granulocytes: 0.01 10*3/uL (ref 0.00–0.07)
Basophils Absolute: 0.1 10*3/uL (ref 0.0–0.1)
Basophils Relative: 1 %
Eosinophils Absolute: 0.1 10*3/uL (ref 0.0–0.5)
Eosinophils Relative: 2 %
HCT: 40.8 % (ref 36.0–46.0)
Hemoglobin: 14.1 g/dL (ref 12.0–15.0)
Immature Granulocytes: 0 %
Lymphocytes Relative: 38 %
Lymphs Abs: 3.2 10*3/uL (ref 0.7–4.0)
MCH: 30.9 pg (ref 26.0–34.0)
MCHC: 34.6 g/dL (ref 30.0–36.0)
MCV: 89.3 fL (ref 80.0–100.0)
Monocytes Absolute: 0.7 10*3/uL (ref 0.1–1.0)
Monocytes Relative: 8 %
Neutro Abs: 4.2 10*3/uL (ref 1.7–7.7)
Neutrophils Relative %: 51 %
Platelets: 289 10*3/uL (ref 150–400)
RBC: 4.57 MIL/uL (ref 3.87–5.11)
RDW: 12.2 % (ref 11.5–15.5)
WBC: 8.2 10*3/uL (ref 4.0–10.5)
nRBC: 0 % (ref 0.0–0.2)

## 2022-12-11 LAB — PREGNANCY, URINE: Preg Test, Ur: NEGATIVE

## 2022-12-11 LAB — LIPASE, BLOOD: Lipase: 41 U/L (ref 11–51)

## 2022-12-11 MED ORDER — FENTANYL CITRATE PF 50 MCG/ML IJ SOSY
50.0000 ug | PREFILLED_SYRINGE | Freq: Once | INTRAMUSCULAR | Status: AC
  Administered 2022-12-11: 50 ug via INTRAVENOUS
  Filled 2022-12-11: qty 1

## 2022-12-11 NOTE — ED Triage Notes (Signed)
Pt to ED from home for flank pain and states "I think it might be a kidney stone". Pt is CAOx4 and in no acute distress and ambulatory in triage. Pt states pain started 2 days ago but denies urinary tract symptoms.

## 2022-12-11 NOTE — ED Provider Notes (Signed)
[facility] Provider Note  Patient Contact: 7:10 PM (approximate)   History   Flank Pain (RIght)   HPI  Jennifer Taylor is a 42 y.o. female presents to the emergency department with right-sided flank pain that started 2 days ago.  Patient denies dysuria, hematuria or increased urinary frequency.  No nausea or vomiting at home.  Patient has been afebrile states that pain currently feels like kidney stone pain.      Physical Exam   Triage Vital Signs: ED Triage Vitals  Enc Vitals Group     BP 12/11/22 1816 (!) 154/97     Pulse Rate 12/11/22 1816 84     Resp 12/11/22 1816 18     Temp 12/11/22 1816 98.2 F (36.8 C)     Temp Source 12/11/22 1816 Oral     SpO2 12/11/22 1816 96 %     Weight 12/11/22 1816 200 lb (90.7 kg)     Height 12/11/22 1816 '5\' 7"'$  (1.702 m)     Head Circumference --      Peak Flow --      Pain Score 12/11/22 1819 8     Pain Loc --      Pain Edu? --      Excl. in [place]? --     Most recent vital signs: Vitals:   12/11/22 1816 12/11/22 2042  BP: (!) 154/97 136/72  Pulse: 84 76  Resp: 18 17  Temp: 98.2 F (36.8 C)   SpO2: 96% 99%     General: Alert and in no acute distress. Eyes:  PERRL. EOMI. Head: No acute traumatic findings ENT:      Nose: No congestion/rhinnorhea.      Mouth/Throat: Mucous membranes are moist. Neck: No stridor. No cervical spine tenderness to palpation. Cardiovascular:  Good peripheral perfusion Respiratory: Normal respiratory effort without tachypnea or retractions. Lungs CTAB. Good air entry to the bases with no decreased or absent breath sounds. Gastrointestinal: Bowel sounds 4 quadrants. Soft and nontender to palpation. No guarding or rigidity. No palpable masses. No distention. Patient has right sided CVA tenderness.  Musculoskeletal: Full range of motion to all extremities.  Neurologic:  No gross focal neurologic deficits are appreciated.  Skin:   No rash noted    ED Results / Procedures /  Treatments   Labs (all labs ordered are listed, but only abnormal results are displayed) Labs Reviewed  URINALYSIS, ROUTINE W REFLEX MICROSCOPIC - Abnormal; Notable for the following components:      Result Value   Color, Urine YELLOW (*)    APPearance CLEAR (*)    Specific Gravity, Urine 1.004 (*)    Hgb urine dipstick SMALL (*)    Bacteria, UA RARE (*)    All other components within normal limits  COMPREHENSIVE METABOLIC PANEL - Abnormal; Notable for the following components:   Sodium 134 (*)    All other components within normal limits  PREGNANCY, URINE  CBC WITH DIFFERENTIAL/PLATELET  LIPASE, BLOOD        RADIOLOGY  I personally viewed and evaluated these images as part of my medical decision making, as well as reviewing the written report by the radiologist.  ED Provider Interpretation: CT renal stone study shows no evidence of nephrolithiasis.   PROCEDURES:  Critical Care performed: No  Procedures   MEDICATIONS ORDERED IN ED: Medications  fentaNYL (SUBLIMAZE) injection 50 mcg (50 mcg Intravenous Given 12/11/22 1957)     IMPRESSION / MDM / ASSESSMENT AND PLAN / ED  COURSE  I reviewed the triage vital signs and the nursing notes.                              Assessment and plan:  Flank pain:   42 year old female presents to the emergency department with right-sided flank pain that started acutely.  Vital signs are reassuring at triage.  On exam, patient was alert, active and nontoxic-appearing.  Patient had a small amount of blood on urinalysis but no other findings concerning for UTI.  CBC, CMP and lipase reassuring.  CT renal stone study shows mild urethral dilation on the right which could indicate recently passed stone.  Upon recheck, patient was resting comfortably and stated that her pain improved, increasing suspicion for recently passed stone.  Supportive medications were encouraged for home use.  Return precautions were given to return with new or  worsening symptoms.   FINAL CLINICAL IMPRESSION(S) / ED DIAGNOSES   Final diagnoses:  Flank pain     Rx / DC Orders   ED Discharge Orders     None        Note:  This document was prepared using Dragon voice recognition software and may include unintentional dictation errors.   Vallarie Mare [place], Hershal Coria 12/11/22 2122    Harvest Dark, MD 12/14/22 1400

## 2024-05-10 NOTE — Progress Notes (Unsigned)
 Sleep Medicine   Office Visit  Patient Name: Jennifer Taylor DOB: 1981/08/15 MRN [id number]    Chief Complaint: sleep evaluation   Brief History:  Jennifer Taylor presents for an initial consult for sleep evaluation and to establish care. Patient has some years history of snoring and gasping. Sleep quality is poor. This is noted every night. The patient's bed partner reports  snoring, gasping at night. The patient relates the following symptoms: headaches, fatigue, trouble concentrating, brain fogginess and memory issues are also present. The patient goes to bed at 1000 pm but is unable to fall asleep till at least 1-2 hours after and wakes up at 0630 am and wakes up at least 4 times in between with trouble returning to sleep.  Sleep quality is the same when outside home environment.  Patient has noted significant movement of her legs at night that would disrupt her sleep.  The patient  relates vivid dreams, occasional sleep walking and will wake up crying in her sleep as unusual behavior during the night.  The patient relates  depression, anxiety, PTSD, and ADHD as a history of psychiatric problems. The Epworth Sleepiness Score is 15 out of 24 .  The patient relates  Cardiovascular risk factors include: HTN.    ROS  General: (-) fever, (-) chills, (-) night sweat Nose and Sinuses: (-) nasal stuffiness or itchiness, (-) postnasal drip, (-) nosebleeds, (-) sinus trouble. Mouth and Throat: (-) sore throat, (-) hoarseness. Neck: (-) swollen glands, (-) enlarged thyroid, (-) neck pain. Respiratory: + cough, - shortness of breath, - wheezing. Neurologic: - numbness, - tingling. Psychiatric: + anxiety, + depression Sleep behavior: -sleep paralysis -hypnogogic hallucinations -dream enactment      +vivid dreams -cataplexy +night terrors +sleep walking   Current Medication: Outpatient Encounter Medications as of 05/13/2024  Medication Sig Note   albuterol (VENTOLIN HFA) 108 (90 Base) MCG/ACT inhaler  INHALE 2 PUFFS BY MOUTH EVERY 6 HOURS AS NEEDED FOR WHEEZE    budesonide-formoterol (SYMBICORT) 160-4.5 MCG/ACT inhaler Inhale 2 puffs into the lungs.    losartan (COZAAR) 25 MG tablet Take 25 mg by mouth daily.    QUEtiapine (SEROQUEL) 25 MG tablet May take half a tablet and up to 2 tabs (50 mg) as needed for sleep.    amphetamine-dextroamphetamine (ADDERALL XR) 30 MG 24 hr capsule Take 30 mg by mouth every morning.    Buprenorphine HCl-Naloxone HCl 8-2 MG FILM Place under the tongue.    cetirizine (ZYRTEC) 10 MG tablet Take 10 mg by mouth daily.    clonazePAM (KLONOPIN) 0.5 MG tablet Take 0.25 mg by mouth daily as needed.    cyclobenzaprine  (FLEXERIL ) 10 MG tablet Take 1 tablet (10 mg total) by mouth 3 (three) times daily as needed for muscle spasms.    lamoTRIgine (LAMICTAL) 100 MG tablet Take 200 mg by mouth daily.    [DISCONTINUED] alprazolam (XANAX) 2 MG tablet Take 2 mg by mouth 3 (three) times daily as needed for anxiety.     [DISCONTINUED] amphetamine-dextroamphetamine (ADDERALL XR) 10 MG 24 hr capsule Take 10 mg by mouth daily.     [DISCONTINUED] citalopram (CELEXA) 20 MG tablet Take 20 mg by mouth daily.     [DISCONTINUED] gabapentin (NEURONTIN) 100 MG capsule Take 100 mg by mouth 3 (three) times daily.    [DISCONTINUED] HYDROcodone -acetaminophen  (NORCO/VICODIN) 5-325 MG per tablet Take 1 tablet by mouth every 4 (four) hours as needed for moderate pain.    [DISCONTINUED] ibuprofen  (ADVIL ) 800 MG tablet Take 1 tablet (800  mg total) by mouth every 8 (eight) hours as needed.    [DISCONTINUED] lamoTRIgine (LAMICTAL) 150 MG tablet Take 300 mg by mouth daily.    [DISCONTINUED] naproxen  (NAPROSYN ) 500 MG tablet Take 1 tablet (500 mg total) by mouth 2 (two) times daily with a meal.    [DISCONTINUED] rizatriptan (MAXALT-MLT) 10 MG disintegrating tablet Take 10 mg by mouth daily as needed. Take 1 tablet everyday as needed for headache 05/27/2015: Received from: [facility]  Received Sig: Take by mouth.   [DISCONTINUED] traZODone (DESYREL) 100 MG tablet Take 200 mg by mouth at bedtime. 05/27/2015: Received from: [facility] Received Sig: Take 200 mg by mouth.   No facility-administered encounter medications on file as of 05/13/2024.    Surgical History: Past Surgical History:  Procedure Laterality Date   TONSILLECTOMY     TUBAL LIGATION      Medical History: Past Medical History:  Diagnosis Date   ADHD (attention deficit hyperactivity disorder)    Anxiety    Bipolar 1 disorder (HCC)    Cancer (HCC)    cervical   Depression    Hypertension    Kidney stones    Kidney stones    PTSD (post-traumatic stress disorder)     Family History: Non contributory to the present illness  Social History: Social History   Socioeconomic History   Marital status: Divorced    Spouse name: Not on file   Number of children: Not on file   Years of education: Not on file   Highest education level: Not on file  Occupational History   Not on file  Tobacco Use   Smoking status: Every Day    Current packs/day: 0.50    Types: Cigarettes   Smokeless tobacco: Never  Substance and Sexual Activity   Alcohol use: Yes   Drug use: No   Sexual activity: Yes    Birth control/protection: Surgical  Other Topics Concern   Not on file  Social History Narrative   Not on file   Social Drivers of Health   Financial Resource Strain: Low Risk  (01/23/2024)   Received from [facility]   Overall Financial Resource Strain (CARDIA)    Difficulty of Paying Living Expenses: Not very hard  Food Insecurity: No Food Insecurity (01/23/2024)   Received from [facility]   Hunger Vital Sign    Within the past 12 months, you worried that your food would run out before you got the money to buy more.: Never true    Within the past 12 months, the food you bought just didn't last and you didn't have money to get more.: Never true  Transportation Needs: No Transportation Needs  (01/23/2024)   Received from [facility]   PRAPARE - Transportation    Lack of Transportation (Medical): No    Lack of Transportation (Non-Medical): No  Physical Activity: Unknown (11/19/2020)   Received from [facility]   Exercise Vital Sign    On average, how many days per week do you engage in moderate to strenuous exercise (like a brisk walk)?: 0 days    Minutes of Exercise per Session: Not on file  Stress: Stress Concern Present (11/19/2020)   Received from [facility] - Occupational Stress Questionnaire    Feeling of Stress : Rather much  Social Connections: Not on file  Intimate Partner Violence: Not At Risk (11/19/2020)   Received from [facility]  Humiliation, Afraid, Rape, and Kick questionnaire    Within the last year, have you been afraid of your partner or ex-partner?: No    Within the last year, have you been humiliated or emotionally abused in other ways by your partner or ex-partner?: No    Within the last year, have you been kicked, hit, slapped, or otherwise physically hurt by your partner or ex-partner?: No    Within the last year, have you been raped or forced to have any kind of sexual activity by your partner or ex-partner?: No    Vital Signs: Blood pressure (!) 168/104, pulse 85, resp. rate 16, height 5' 8 (1.727 m), weight 211 lb (95.7 kg), last menstrual period 01/21/2016, SpO2 98%. Body mass index is 32.08 kg/m.   Examination: General Appearance: The patient is well-developed, well-nourished, and in no distress. Neck Circumference: 44 cm Skin: Gross inspection of skin unremarkable. Head: normocephalic, no gross deformities. Eyes: no gross deformities noted. ENT: ears appear grossly normal Neurologic: Alert and oriented. No involuntary movements.    STOP BANG RISK ASSESSMENT S (snore) Have you been told that you snore?     YES   T (tired) Are you often tired, fatigued, or sleepy during the  day?   YES  O (obstruction) Do you stop breathing, choke, or gasp during sleep? YES   P (pressure) Do you have or are you being treated for high blood pressure? YES   B (BMI) Is your body index greater than 35 kg/m? NO   A (age) Are you 38 years old or older? NO   N (neck) Do you have a neck circumference greater than 16 inches?   YES   G (gender) Are you a female? NO   TOTAL STOP/BANG "YES" ANSWERS 5                                                               A STOP-Bang score of 2 or less is considered low risk, and a score of 5 or more is high risk for having either moderate or severe OSA. For people who score 3 or 4, doctors may need to perform further assessment to determine how likely they are to have OSA.         EPWORTH SLEEPINESS SCALE:  Scale:  (0)= no chance of dozing; (1)= slight chance of dozing; (2)= moderate chance of dozing; (3)= high chance of dozing  Chance  Situtation    Sitting and reading: 2    Watching TV: 2    Sitting Inactive in public: 2    As a passenger in car: 3      Lying down to rest: 3    Sitting and talking: 0    Sitting quielty after lunch: 3    In a car, stopped in traffic: 0   TOTAL SCORE:   15 out of 24    SLEEP STUDIES:  None   LABS: No results found for this or any previous visit (from the past 2160 hours).  Radiology: CT Renal Stone Study Result Date: 12/11/2022 CLINICAL DATA:  Flank pain. EXAM: CT ABDOMEN AND PELVIS WITHOUT CONTRAST TECHNIQUE: Multidetector CT imaging of the abdomen and pelvis was performed following the standard protocol without IV contrast. RADIATION DOSE REDUCTION: This exam was performed  according to the departmental dose-optimization program which includes automated exposure control, adjustment of the mA and/or kV according to patient size and/or use of iterative reconstruction technique. COMPARISON:  CT abdomen pelvis 03/12/2015 FINDINGS: Lower chest: Normal heart size.  Lung bases are clear.  Hepatobiliary: Liver is normal in size and contour. Gallbladder is unremarkable. Pancreas: Unremarkable Spleen: Unremarkable Adrenals/Urinary Tract: Normal adrenal glands. 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 Mild right pelviectasis and dilatation of the proximal right ureters which appears similar when compared to remote prior CT examination. Punctate 2 mm stone inferior pole left kidney. Urinary bladder is unremarkable. Stomach/Bowel: No abnormal bowel wall thickening no evidence for bowel obstruction. No free fluid or free intraperitoneal air. Normal morphology of the stomach. Vascular/Lymphatic: Normal caliber abdominal aorta. Peripheral calcified atherosclerotic plaque. No retroperitoneal lymphadenopathy. Reproductive: Prior hysterectomy. There is a 4 cm cyst within the left ovary (image 60; series 2). Normal appearance of the right ovary. Other: None. Musculoskeletal: No aggressive or acute appearing osseous lesions. IMPRESSION: 1. 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 Mild right pelviectasis and dilatation of the proximal right ureters which appears similar when compared to remote prior CT examination. Proximal ureteral dilatation may be normal for the patient, given similar appearance on remote prior CT. Alternatively, this may be reflective of a recently passed right renal stone. 2. Punctate 2 mm stone inferior pole left kidney. 3. 4 cm left ovarian simple-appearing cyst. No follow-up imaging is recommended. Reference: JACR 2020 Feb;17(2):248-254 Electronically Signed   By: Bard Moats M.D.   On: 12/11/2022 20:15    No results found.  No results found.    Assessment and Plan: Patient Active Problem List   Diagnosis Date Noted   Suspected sleep apnea 05/13/2024   Primary hypertension 03/18/2021   Other specified behavioral and emotional disorders with onset usually occurring in childhood and  adolescence 08/30/2015   PTSD (post-traumatic stress disorder) 08/25/2015   Bipolar 1 disorder (HCC) 11/03/2014   Anxiety 11/03/2014   1. Suspected sleep apnea (Primary) Patient evaluation suggests high risk of sleep disordered breathing due to witnessed apnea, snoring, gasping, daytime sleepiness, opiate use, headaches.  Patient has comorbid cardiovascular risk factors including: hypertension which could be exacerbated by pathologic sleep-disordered breathing.  Suggest: PSG to assess/treat the patient's sleep disordered breathing. The patient was also counselled on weight loss to optimize sleep health  2. Primary hypertension Patient's blood pressure was elevated and she has not taken her medication in a few days. She has a headache but no other neurological symptoms. She is advised to call her provider.      General Counseling: I have discussed the findings of the evaluation and examination with Rosaline.  I have also discussed any further diagnostic evaluation thatmay be needed or ordered today. Alayah verbalizes understanding of the findings of todays visit. We also reviewed her medications today and discussed drug interactions and side effects including but not limited excessive drowsiness and altered mental states. We also discussed that there is always a risk not just to her but also people around her. she has been encouraged to call the office with any questions or concerns that should arise related to todays visit.  No orders of the defined types were placed in this encounter.       I have personally obtained a history, evaluated the patient, evaluated pertinent data, formulated the assessment and plan and placed orders.   This patient was seen  today by Lauraine Lay, PA-C in collaboration with Dr. Elfreda Bathe.   Elfreda DELENA Bathe, MD [facility] Diplomate ABMS Pulmonary and Critical Care Medicine Sleep medicine

## 2024-05-13 ENCOUNTER — Ambulatory Visit (INDEPENDENT_AMBULATORY_CARE_PROVIDER_SITE_OTHER): Payer: Self-pay | Admitting: Internal Medicine

## 2024-05-13 VITALS — BP 168/104 | HR 85 | Resp 16 | Ht 68.0 in | Wt 211.0 lb

## 2024-05-13 DIAGNOSIS — I10 Essential (primary) hypertension: Secondary | ICD-10-CM

## 2024-05-13 DIAGNOSIS — R29818 Other symptoms and signs involving the nervous system: Secondary | ICD-10-CM
